# Patient Record
Sex: Male | Born: 1950 | Race: Black or African American | Hispanic: Yes | Marital: Married | Smoking: Former smoker
Health system: Southern US, Community
[De-identification: ages and names within clinical notes are randomized; demographics above are authoritative.]

## PROBLEM LIST (undated history)

## (undated) DIAGNOSIS — I10 Essential (primary) hypertension: Secondary | ICD-10-CM

## (undated) DIAGNOSIS — Z923 Personal history of irradiation: Secondary | ICD-10-CM

## (undated) DIAGNOSIS — Z1379 Encounter for other screening for genetic and chromosomal anomalies: Principal | ICD-10-CM

## (undated) DIAGNOSIS — C3490 Malignant neoplasm of unspecified part of unspecified bronchus or lung: Secondary | ICD-10-CM

## (undated) DIAGNOSIS — C439 Malignant melanoma of skin, unspecified: Secondary | ICD-10-CM

## (undated) DIAGNOSIS — C801 Malignant (primary) neoplasm, unspecified: Secondary | ICD-10-CM

## (undated) HISTORY — PX: MELANOMA EXCISION: SHX5266

## (undated) HISTORY — DX: Encounter for other screening for genetic and chromosomal anomalies: Z13.79

---

## 1986-12-30 HISTORY — PX: ROTATOR CUFF REPAIR: SHX139

## 2006-12-30 DIAGNOSIS — C801 Malignant (primary) neoplasm, unspecified: Secondary | ICD-10-CM

## 2006-12-30 HISTORY — PX: PROSTATE SURGERY: SHX751

## 2006-12-30 HISTORY — DX: Malignant (primary) neoplasm, unspecified: C80.1

## 2011-12-31 DIAGNOSIS — C439 Malignant melanoma of skin, unspecified: Secondary | ICD-10-CM

## 2011-12-31 HISTORY — DX: Malignant melanoma of skin, unspecified: C43.9

## 2016-08-26 ENCOUNTER — Ambulatory Visit: Payer: BLUE CROSS/BLUE SHIELD | Attending: Nurse Practitioner | Admitting: Physical Therapy

## 2016-08-26 ENCOUNTER — Encounter: Payer: Self-pay | Admitting: Physical Therapy

## 2016-08-26 DIAGNOSIS — I890 Lymphedema, not elsewhere classified: Secondary | ICD-10-CM | POA: Diagnosis not present

## 2016-08-26 NOTE — Therapy (Signed)
[place], [name], [address] Phone: [phone]   Fax:  [phone]  Physical Therapy Evaluation  Patient Details  Name: William Wilkerson MRN: [id number] Date of Birth: 03/21/1951 Referring Provider: Dr. Ann Held Long  Encounter Date: 08/26/2016      PT End of Session - 08/26/16 1702    Visit Number 1   Number of Visits 25   Date for PT Re-Evaluation 10/21/16   PT Start Time 1608   PT Stop Time 1650   PT Time Calculation (min) 42 min   Activity Tolerance Patient tolerated treatment well   Behavior During Therapy [facility] for tasks assessed/performed      History reviewed. No pertinent past medical history.  History reviewed. No pertinent surgical history.  There were no vitals filed for this visit.       Subjective Assessment - 08/26/16 1620    Subjective I had surgery to remove the lymph nodes underneath my right arm and since then I have had swelling but it has gotten puffier. I can not wear the rings on that hand.    Pertinent History hx of prostate cancer, hx of stage III melanoma   Patient Stated Goals to get my arm normal looking   Currently in Pain? Yes   Pain Score 4    Pain Location Hand   Pain Orientation Right   Pain Descriptors / Indicators Aching   Pain Type Chronic pain   Pain Onset More than a month ago   Pain Frequency Constant   Aggravating Factors  not taking the Advil   Pain Relieving Factors Advil            [facility] PT Assessment - 08/26/16 0001      Assessment   Medical Diagnosis metastatic melanoma   Referring Provider Dr. Ann Held Long   Onset Date/Surgical Date 01/19/16   Hand Dominance Right   Prior Therapy none     Precautions   Precautions Other (comment)  lymphedema     Restrictions   Weight Bearing Restrictions No     Balance Screen   Has the patient fallen in the past 6 months No   Has the patient had a decrease in activity level  because of a fear of falling?  No   Is the patient reluctant to leave their home because of a fear of falling?  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, mostly driving    Leisure pt states he does not formally exercise     Cognition   Overall Cognitive Status Within Functional Limits for tasks assessed     Observation/Other Assessments   Other Surveys  --  LLIS: 25% impairment     ROM / Strength   AROM / PROM / Strength AROM     AROM   Overall AROM  Within functional limits for tasks performed           LYMPHEDEMA/ONCOLOGY QUESTIONNAIRE - 08/26/16 1634      Type  Cancer Type stage III metastatic melanoma     Surgeries   Axillary Lymph Node Dissection Date 01/19/16   Number Lymph Nodes Removed 26     Date Lymphedema/Swelling Started   Date 01/19/16     Treatment   Active Chemotherapy Treatment No   Past Chemotherapy Treatment No   Active Radiation Treatment No   Past Radiation Treatment No     What other symptoms do you have   Are you Having Heaviness or Tightness Yes   Are you having Pain Yes   Are you having pitting edema No   Is it Hard or Difficult finding clothes that fit Yes   Do you have infections No   Is there Decreased scar mobility Yes     Lymphedema Assessments   Lymphedema Assessments Upper extremities     Right Upper Extremity Lymphedema   15 cm Proximal to Olecranon Process 37.7 cm   Olecranon Process 34.4 cm   15 cm Proximal to Ulnar Styloid Process 33.8 cm   Just Proximal to Ulnar Styloid Process 21.1 cm   Across  Hand at [employer] 25 cm   At [place] of 2nd Digit 8 cm     Left Upper Extremity Lymphedema   15 cm Proximal to Olecranon Process 36.4 cm   Olecranon Process 32.6 cm   15 cm Proximal to Ulnar Styloid Process 30.5 cm   Just Proximal to Ulnar Styloid Process 18.9 cm   Across Hand at [employer] 23 cm   At [place] of 2nd Digit 7.5 cm                        PT Education - 08/26/16 1701    Education provided Yes   Education Details anatomy and physiology of lymphatic system and course of therapy   Person(s) Educated Patient   Methods Explanation   Comprehension Verbalized understanding           Short Term Clinic Goals - 08/26/16 1707      CC Short Term Goal  #1   Title [name] will be independent in application of compression bandaging for long term management of lymphedema.    Time 4   Period Weeks   Status New     CC Short Term Goal  #2   Title Pt will demonstrate a 1 cm decrease in circumferential measurements 15 cm proximal to ulnar styloid process on right upper extremity   Baseline 33.8   Time 4   Period Weeks   Status New     CC Short Term Goal  #3   Title Pt will be able to independently verbalize lymphedema risk reduction practices   Time 4   Period Weeks   Status New             Long Term Clinic Goals - 08/26/16 1709      CC Long Term Goal  #1   Title Pt will obtain appropriate compression garments for R UE and hand for long term management of lymphedema   Time 8   Period Weeks   Status New     CC Long Term Goal  #2   Title Pt will demonstrate a 2 cm decrease in circumferential measurements 15 cm proximal to ulnar styloid process   Baseline 33.8   Time 8   Period Weeks   Status New     CC Long Term Goal  #3   Title Pt  will be independent in self manual lymphatic drainage technique   Time 8   Period Weeks   Status New            Plan - 08/26/16 1702    Clinical Impression Statement Pt presents to PT  with history of metastatic melanoma with axillary node dissection in January 2017. Pt has developed right upper extremity edema from hand to axilla that has been present since surgery. He is unable to wear his rings on his right hand secondary to edema and he feels tightness. Pt's ROM is within functional limits throughout bilateral UE. He would benefit from skilled PT services to decrease right upper extremity lymphedema and assist pt with obtaining compression garments for long term management of lymphedema.    Rehab Potential Good   Clinical Impairments Affecting Rehab Potential pt's job may make it difficult for pt to attend appts 3x/wk   PT Frequency 3x / week   PT Duration 8 weeks   PT Treatment/Interventions ADLs/Self Care Home Management;Compression bandaging;Scar mobilization;Manual lymph drainage;Manual techniques;Taping   PT Next Visit Plan instruct pt and his wife how to apply compression bandages so they are able to do this independently   Recommended Other Services pt may benefit from a [employer]   Consulted and Agree with Plan of Care Patient      Patient will benefit from skilled therapeutic intervention in order to improve the following deficits and impairments:  Increased edema, Pain  Visit Diagnosis: Lymphedema, not elsewhere classified - Plan: PT plan of care cert/re-cert      G-Codes - 39/03/00 1721    Functional Assessment Tool Used LLIS   Functional Limitation Other PT primary   Other PT Primary Current Status (P2330) At least 20 percent but less than 40 percent impaired, limited or restricted   Other PT Primary Goal Status (Q7622) At least 1 percent but less than 20 percent impaired, limited or restricted       Problem List There are no active problems to display for this patient.   Alexia Freestone 08/26/2016, 5:24 PM  [place], [name], [address] Phone: [phone]   Fax:   [phone]  Name: William Wilkerson MRN: [id number] Date of Birth: Oct 26, 1951  Allyson Sabal, PT 08/26/16 5:24 PM

## 2016-08-28 ENCOUNTER — Ambulatory Visit: Payer: BLUE CROSS/BLUE SHIELD

## 2016-08-28 DIAGNOSIS — I890 Lymphedema, not elsewhere classified: Secondary | ICD-10-CM | POA: Diagnosis not present

## 2016-08-28 NOTE — Therapy (Signed)
[place], [name], [address] Phone: [phone]   Fax:  [phone]  Physical Therapy Treatment  Patient Details  Name: William Wilkerson MRN: [id number] Date of Birth: July 17, 1951 Referring Provider: Dr. Ann Held Long  Encounter Date: 08/28/2016      PT End of Session - 08/28/16 1652    Visit Number 2   Number of Visits 25   Date for PT Re-Evaluation 10/21/16   PT Start Time 6226   PT Stop Time 1636   PT Time Calculation (min) 79 min   Activity Tolerance Patient tolerated treatment well   Behavior During Therapy [facility] for tasks assessed/performed      No past medical history on file.  No past surgical history on file.  There were no vitals filed for this visit.      Subjective Assessment - 08/28/16 1522    Subjective My Rt hand finger joints are sore today, but they normally are.    Pertinent History hx of prostate cancer, hx of stage III melanoma   Patient Stated Goals to get my arm normal looking   Currently in Pain? No/denies                         [facility] Adult PT Treatment/Exercise - 08/28/16 0001      Self-Care   Other Self-Care Comments  Instructed pt and wife in proper care of bandages and how to wash them if they need it before next session. Also instructed pt in importance of compliance with bandaging as much as possible over the next weeks of his Active Phase of treatment.     Manual Therapy   Manual Lymphatic Drainage (MLD) In Supine: Short neck, superficial and deep abdominals, Rt inguinal nodes, Rt axillo-inguinal anastomosis, Lt axillary nodes and anterior inter-axillary anastomosis and then Rt UE from dorsal hand to lateral upper arm working proximal to distal then retracing all steps explaining principles of manual lymph drainage while performing.   Compression Bandaging Biotone lotion applied, stockinette, Elastomull to fingers 1-4, Artiflex, 1-6, 1-10 and  2-12 cm short stretch compression bandages from dorsal hand to axilla instructing pt and wife throughout.                 PT Education - 08/28/16 1649    Education provided Yes   Education Details Continued with anatomy of lymphatic system and principles of manual lymph drainage; also instructed in compression bandaging while applying to pts arm today   Person(s) Educated Patient;Spouse   Methods Explanation;Demonstration;Handout  Handout for bandaging and wife videorecorded with cellphone while therapist applied these.   Comprehension Verbalized understanding;Need further instruction           Short Term Clinic Goals - 08/26/16 1707      CC Short Term Goal  #1   Title [name] will be independent in application of compression bandaging for long term management of lymphedema.    Time 4   Period Weeks   Status New     CC Short Term Goal  #2   Title Pt will demonstrate a 1 cm decrease in circumferential measurements 15 cm proximal to ulnar styloid process on right upper extremity   Baseline 33.8   Time 4   Period Weeks   Status New     CC Short Term Goal  #3   Title Pt will be able to independently verbalize lymphedema risk reduction practices  Time 4   Period Weeks   Status New             Long Term Clinic Goals - 08/26/16 1709      CC Long Term Goal  #1   Title Pt will obtain appropriate compression garments for R UE and hand for long term management of lymphedema   Time 8   Period Weeks   Status New     CC Long Term Goal  #2   Title Pt will demonstrate a 2 cm decrease in circumferential measurements 15 cm proximal to ulnar styloid process   Baseline 33.8   Time 8   Period Weeks   Status New     CC Long Term Goal  #3   Title Pt will be independent in self manual lymphatic drainage technique   Time 8   Period Weeks   Status New            Plan - 08/28/16 1652    Clinical Impression Statement Initial session today of complete  decongestive therapy. Wife present for session. Pt and wife demonstrated a good understanding of basic principles of manual lymph drainage by end of session today and also and good basic understanding of compression bandaging. Wife videorecorded this with her phone so she can try to reapply over long holiday weekend before pt returns for next visit in a week. Pt reported bandage felt good upon leaving with no tingling or c/o pain in Rt UE.   Rehab Potential Good   Clinical Impairments Affecting Rehab Potential pt's job may make it difficult for pt to attend appts 3x/wk   PT Frequency 3x / week   PT Duration 8 weeks   PT Treatment/Interventions ADLs/Self Care Home Management;Compression bandaging;Scar mobilization;Manual lymph drainage;Manual techniques;Taping   PT Next Visit Plan Review bandaging prn with pt (an dwife if she is able to come again) and continue complete decongestive therapy. Measure circumference at next visit as a week will have gone by.   Consulted and Agree with Plan of Care Patient;Family member/caregiver   Family Member Consulted Wife      Patient will benefit from skilled therapeutic intervention in order to improve the following deficits and impairments:  Increased edema, Pain  Visit Diagnosis: Lymphedema, not elsewhere classified     Problem List There are no active problems to display for this patient.   Otelia Limes, PTA 08/28/2016, 4:58 PM  [place], [name], [address] Phone: [phone]   Fax:  [phone]  Name: William Wilkerson MRN: [id number] Date of Birth: 1951/04/18

## 2016-09-04 ENCOUNTER — Ambulatory Visit: Payer: BLUE CROSS/BLUE SHIELD

## 2016-09-05 ENCOUNTER — Ambulatory Visit: Payer: BLUE CROSS/BLUE SHIELD | Admitting: Physical Therapy

## 2016-09-09 ENCOUNTER — Ambulatory Visit: Payer: BLUE CROSS/BLUE SHIELD | Attending: Nurse Practitioner | Admitting: Physical Therapy

## 2016-09-09 DIAGNOSIS — I890 Lymphedema, not elsewhere classified: Secondary | ICD-10-CM | POA: Insufficient documentation

## 2016-09-09 NOTE — Therapy (Signed)
[place], [name], [address] Phone: [phone]   Fax:  [phone]  Physical Therapy Treatment  Patient Details  Name: William Wilkerson MRN: [id number] Date of Birth: [birth year] Referring Provider: Dr. Ann Held Long  Encounter Date: 09/09/2016      PT End of Session - 09/09/16 2013    Visit Number 3   Number of Visits 25   Date for PT Re-Evaluation 10/21/16   PT Start Time 1510   PT Stop Time 1605   PT Time Calculation (min) 55 min   Activity Tolerance Patient tolerated treatment well   Behavior During Therapy [facility] for tasks assessed/performed      No past medical history on file.  No past surgical history on file.  There were no vitals filed for this visit.      Subjective Assessment - 09/09/16 1512    Subjective I had to take the wrap off a couple days after being here last time, but I was surprised how much    Currently in Pain? Yes   Pain Score 4    Pain Location Axilla   Pain Orientation Right   Pain Descriptors / Indicators Aching   Aggravating Factors  mashing on it   Pain Relieving Factors Advil                         [facility] Adult PT Treatment/Exercise - 09/09/16 0001      Self-Care   Other Self-Care Comments  Educated patient further about the necessity of wearing bandages from one appointment to the next and not removing them early; educated briefly about daytime, nighttime garments and about availability of pumps to mimic manual lymph drainage.     Manual Therapy   Manual Lymphatic Drainage (MLD) In Supine: Short neck, superficial and deep abdominals, Rt inguinal nodes, Rt axillo-inguinal anastomosis, Lt axillary nodes and anterior inter-axillary anastomosis and then Rt UE from dorsal hand to lateral upper arm working proximal to distal then retracing all steps.  This performed by Saverio Danker, SPT, with direct supervision by Serafina Royals, PT.   Compression Bandaging Lotion applied, stockinette, Elastomull to fingers 1-4, Artiflex, 1-6, 1-10 and 2-12 cm short stretch compression bandages from dorsal hand to axilla.  Issued new bandages today.                PT Education - 09/09/16 2012    Education provided Yes   Education Details reviewed remedial UE lymphedema exercise and reminded patient to do this in case of pain or paresthesias, but to remove bandages if either of these is unrelieved   Person(s) Educated Patient   Methods Explanation;Demonstration   Comprehension Verbalized understanding           Short Term Clinic Goals - 08/26/16 1707      CC Short Term Goal  #1   Title [name] will be independent in application of compression bandaging for long term management of lymphedema.    Time 4   Period Weeks   Status New     CC Short Term Goal  #2   Title Pt will demonstrate a 1 cm decrease in circumferential measurements 15 cm proximal to ulnar styloid process on right upper extremity   Baseline 33.8   Time 4   Period Weeks   Status New     CC Short Term Goal  #3   Title Pt will be able to independently verbalize  lymphedema risk reduction practices   Time 4   Period Weeks   Status New             Long Term Clinic Goals - 08/26/16 1709      CC Long Term Goal  #1   Title Pt will obtain appropriate compression garments for R UE and hand for long term management of lymphedema   Time 8   Period Weeks   Status New     CC Long Term Goal  #2   Title Pt will demonstrate a 2 cm decrease in circumferential measurements 15 cm proximal to ulnar styloid process   Baseline 33.8   Time 8   Period Weeks   Status New     CC Long Term Goal  #3   Title Pt will be independent in self manual lymphatic drainage technique   Time 8   Period Weeks   Status New            Plan - 09/09/16 2013    Clinical Impression Statement Patient came in reporting a signficant improvement in swelling after  having had bandages on for a couple of days; bandages were removed and some of the swelling returned, but he was able to see that this treatment works.  Despite that, patient questioned his need to keep bandages on until subsequent treatment sessions, so this was reviewed with him.  We discussed that if he keeps the bandages on he may not need to continue complete decongestive therapy for very long.   Rehab Potential Good   Clinical Impairments Affecting Rehab Potential pt's job may make it difficult for pt to attend appts 3x/wk   PT Frequency 3x / week   PT Duration 8 weeks   PT Treatment/Interventions ADLs/Self Care Home Management;Compression bandaging;Scar mobilization;Manual lymph drainage;Manual techniques;Taping   PT Next Visit Plan Review bandaging prn with pt (and wife if she is able to come again) and continue complete decongestive therapy. Measure circumferences.  Begin going over day and nighttime garments for patient to make choices about these.   PT Home Exercise Plan remedial UE lymphedema exercises   Recommended Other Services will fax face sheet to [employer]   Consulted and Agree with Plan of Care Patient;Family member/caregiver      Patient will benefit from skilled therapeutic intervention in order to improve the following deficits and impairments:  Increased edema, Pain  Visit Diagnosis: Lymphedema, not elsewhere classified     Problem List There are no active problems to display for this patient.   [place] 09/09/2016, 8:18 PM  [place], [name], [address] Phone: [phone]   Fax:  [phone]  Name: William Wilkerson MRN: [id number] Date of Birth: 10-05-1951   Serafina Royals, PT 09/09/16 8:19 PM

## 2016-09-11 ENCOUNTER — Ambulatory Visit: Payer: BLUE CROSS/BLUE SHIELD

## 2016-09-11 DIAGNOSIS — I890 Lymphedema, not elsewhere classified: Secondary | ICD-10-CM | POA: Diagnosis not present

## 2016-09-11 NOTE — Therapy (Signed)
[place], [name], [address] Phone: [phone]   Fax:  [phone]  Physical Therapy Treatment  Patient Details  Name: Aquiles Ruffini MRN: [id number] Date of Birth: 1951/02/09 Referring Provider: Dr. Ann Held Long  Encounter Date: 09/11/2016      PT End of Session - 09/11/16 1706    Visit Number 4   Number of Visits 25   Date for PT Re-Evaluation 10/21/16   PT Start Time 1602   PT Stop Time 1647   PT Time Calculation (min) 45 min   Activity Tolerance Patient tolerated treatment well   Behavior During Therapy [facility] for tasks assessed/performed      No past medical history on file.  No past surgical history on file.  There were no vitals filed for this visit.      Subjective Assessment - 09/11/16 1606    Subjective The wrap was a too tight so I had to take it off yesterday evening.    Pertinent History hx of prostate cancer, hx of stage III melanoma   Patient Stated Goals to get my arm normal looking   Currently in Pain? Yes   Pain Score 4    Pain Location Finger (Comment which one)  PIP joints   Pain Orientation Right   Pain Descriptors / Indicators Aching   Pain Type Chronic pain   Pain Onset More than a month ago   Pain Frequency Constant   Aggravating Factors  increased pressure here   Pain Relieving Factors when it's less swollen               LYMPHEDEMA/ONCOLOGY QUESTIONNAIRE - 09/11/16 1608      Right Upper Extremity Lymphedema   15 cm Proximal to Olecranon Process 38.3 cm   Olecranon Process 34.4 cm   15 cm Proximal to Ulnar Styloid Process 32.8 cm   Just Proximal to Ulnar Styloid Process 21.6 cm   Across Hand at [employer] 25.6 cm   At [place] of 2nd Digit 8.1 cm                  [facility] Adult PT Treatment/Exercise - 09/11/16 0001      Manual Therapy   Manual Lymphatic Drainage (MLD) In Supine: Short neck, superficial and deep abdominals, Rt  inguinal nodes, Rt axillo-inguinal anastomosis, Lt axillary nodes and anterior inter-axillary anastomosis and then Rt UE from dorsal hand to lateral upper arm working proximal to distal then retracing all steps.   Compression Bandaging Lotion applied, stockinette, Elastomull to fingers 1-4, Artiflex with 1/2" gray foam at dorsal hand, 1-6, 1-10 and 2-12 cm short stretch compression bandages from dorsal hand to axilla done by Saverio Danker, SPT with direct supervision by Collie Siad, PTA.                   Short Term Clinic Goals - 09/11/16 1711      CC Short Term Goal  #1   Title [name] will be independent in application of compression bandaging for long term management of lymphedema.    Status On-going     CC Short Term Goal  #2   Title Pt will demonstrate a 1 cm decrease in circumferential measurements 15 cm proximal to ulnar styloid process on right upper extremity   Baseline 33.8; 32.8 cm 09/11/16   Status Achieved     CC Short Term Goal  #3   Title Pt will be able to  independently verbalize lymphedema risk reduction practices   Status On-going             Long Term Clinic Goals - 08/26/16 1709      CC Long Term Goal  #1   Title Pt will obtain appropriate compression garments for R UE and hand for long term management of lymphedema   Time 8   Period Weeks   Status New     CC Long Term Goal  #2   Title Pt will demonstrate a 2 cm decrease in circumferential measurements 15 cm proximal to ulnar styloid process   Baseline 33.8   Time 8   Period Weeks   Status New     CC Long Term Goal  #3   Title Pt will be independent in self manual lymphatic drainage technique   Time 8   Period Weeks   Status New            Plan - 09/11/16 1706    Clinical Impression Statement Pt reported his bandage had felt too tight after last visit and had to remove ityesterday evening. His circumference measurements overall were reduced today from initial  visit and pt was pleased at this. Upon leaving pt reported he could tell foam at hand felt good and bandages weren't too tight.    Rehab Potential Good   Clinical Impairments Affecting Rehab Potential pt's job may make it difficult for pt to attend appts 3x/wk   PT Frequency 3x / week   PT Duration 8 weeks   PT Treatment/Interventions ADLs/Self Care Home Management;Compression bandaging;Scar mobilization;Manual lymph drainage;Manual techniques;Taping   PT Next Visit Plan Review bandaging prn with pt (and wife if she is able to come again) and continue complete decongestive therapy. Begin going over day and nighttime garments for patient to make choices about these.   PT Home Exercise Plan remedial UE lymphedema exercises   Consulted and Agree with Plan of Care Patient      Patient will benefit from skilled therapeutic intervention in order to improve the following deficits and impairments:  Increased edema, Pain  Visit Diagnosis: Lymphedema, not elsewhere classified     Problem List There are no active problems to display for this patient.   Otelia Limes, PTA 09/11/2016, 5:12 PM  [place], [name], [address] Phone: [phone]   Fax:  [phone]  Name: Deshane Cotroneo MRN: [id number] Date of Birth: Sep 13, 1951

## 2016-09-12 ENCOUNTER — Ambulatory Visit: Payer: BLUE CROSS/BLUE SHIELD

## 2016-09-12 DIAGNOSIS — I890 Lymphedema, not elsewhere classified: Secondary | ICD-10-CM

## 2016-09-12 NOTE — Therapy (Signed)
[place], [name], [address] Phone: [phone]   Fax:  [phone]  Physical Therapy Treatment  Patient Details  Name: Kinney Sackmann MRN: [id number] Date of Birth: [birth year] Referring Provider: Dr. Ann Held Long  Encounter Date: 09/12/2016      PT End of Session - 09/12/16 1533    Visit Number 5   Number of Visits 25   Date for PT Re-Evaluation 10/21/16   PT Start Time 1062   PT Stop Time 1525   PT Time Calculation (min) 42 min   Activity Tolerance Patient tolerated treatment well   Behavior During Therapy [facility] for tasks assessed/performed      No past medical history on file.  No past surgical history on file.  There were no vitals filed for this visit.      Subjective Assessment - 09/12/16 1446    Subjective The bandage was a little tight at the elbow but I feel like my arm went down. My thumb is hurting a little, maybe we should wrap it today.    Pertinent History hx of prostate cancer, hx of stage III melanoma   Patient Stated Goals to get my arm normal looking   Currently in Pain? Yes   Pain Score 4    Pain Location Finger (Comment which one)  Thumb   Pain Orientation Right   Pain Descriptors / Indicators Aching;Dull   Pain Type Acute pain   Pain Onset Yesterday   Pain Frequency Constant   Aggravating Factors  not being wrapped maybe   Pain Relieving Factors being wrapped               LYMPHEDEMA/ONCOLOGY QUESTIONNAIRE - 09/12/16 1448      Right Upper Extremity Lymphedema   15 cm Proximal to Olecranon Process 38.1 cm   Olecranon Process 33.4 cm   15 cm Proximal to Ulnar Styloid Process 33.1 cm   Just Proximal to Ulnar Styloid Process 21.6 cm   Across Hand at [employer] 24.4 cm   At [place] of 2nd Digit 8 cm                  [facility] Adult PT Treatment/Exercise - 09/12/16 0001      Manual Therapy   Manual therapy comments Remeaseured circumference  again today per pt request   Manual Lymphatic Drainage (MLD) In Supine: Short neck, superficial and deep abdominals, Rt inguinal nodes, Rt axillo-inguinal anastomosis, Lt axillary nodes and anterior inter-axillary anastomosis and then Rt UE from dorsal hand to lateral upper arm working proximal to distal then retracing all steps.   Compression Bandaging Lotion applied, stockinette, Elastomull to fingers 1-4, Artiflex with 1/2" gray foam at dorsal hand, 1-6, 1-10 and 1-12 cm short stretch compression bandages from dorsal hand to axilla.                   Short Term Clinic Goals - 09/11/16 1711      CC Short Term Goal  #1   Title [name] will be independent in application of compression bandaging for long term management of lymphedema.    Status On-going     CC Short Term Goal  #2   Title Pt will demonstrate a 1 cm decrease in circumferential measurements 15 cm proximal to ulnar styloid process on right upper extremity   Baseline 33.8; 32.8 cm 09/11/16   Status Achieved     CC Short Term Goal  #3  Title Pt will be able to independently verbalize lymphedema risk reduction practices   Status On-going             Long Term Clinic Goals - 08/26/16 1709      CC Long Term Goal  #1   Title Pt will obtain appropriate compression garments for R UE and hand for long term management of lymphedema   Time 8   Period Weeks   Status New     CC Long Term Goal  #2   Title Pt will demonstrate a 2 cm decrease in circumferential measurements 15 cm proximal to ulnar styloid process   Baseline 33.8   Time 8   Period Weeks   Status New     CC Long Term Goal  #3   Title Pt will be independent in self manual lymphatic drainage technique   Time 8   Period Weeks   Status New            Plan - 09/12/16 1535    Clinical Impression Statement Pt reported his bandages felt better than 2 visits ago but still felt  alittle tight at the forearm/elbow area and took them off  shortly before coming in for appointment today so only used 1-12 cm bandage today to see if this would help him tolerate wear longer. He did feel his arm looked smaller though and some of his measurements were, indeed, smaller when compared to yesterday which pt was pleased with. He will probably be ready to be measured for his compression garments soon as he is unable to be consistent with appointments due to his job and, thus bandages don't stay on for long enough periods of time to be as effective as they could be. Also issued pt business card for [employer], [employer] rep, and pt reported he had already heard from him but needs to call him back.    Rehab Potential Good   Clinical Impairments Affecting Rehab Potential pt's job may make it difficult for pt to attend appts 3x/wk   PT Frequency 3x / week   PT Duration 8 weeks   PT Treatment/Interventions ADLs/Self Care Home Management;Compression bandaging;Scar mobilization;Manual lymph drainage;Manual techniques;Taping   PT Next Visit Plan Review bandaging prn with pt (and wife if she is able to come again) and continue complete decongestive therapy. Begin going over day and nighttime garments for patient to make choices about these and possibly have pt measured soon.   PT Home Exercise Plan remedial UE lymphedema exercises; try to rewrap arm between visits   Consulted and Agree with Plan of Care Patient      Patient will benefit from skilled therapeutic intervention in order to improve the following deficits and impairments:  Increased edema, Pain  Visit Diagnosis: Lymphedema, not elsewhere classified     Problem List There are no active problems to display for this patient.   Otelia Limes, PTA 09/12/2016, 3:40 PM  [place], [name], [address] Phone: [phone]   Fax:  [phone]  Name: Kyrell Ruacho MRN: [id number] Date of Birth:  23-Jul-1951

## 2016-09-16 ENCOUNTER — Ambulatory Visit: Payer: BLUE CROSS/BLUE SHIELD | Admitting: Physical Therapy

## 2016-09-16 DIAGNOSIS — I890 Lymphedema, not elsewhere classified: Secondary | ICD-10-CM

## 2016-09-16 NOTE — Therapy (Signed)
[place], [name], [address] Phone: [phone]   Fax:  [phone]  Physical Therapy Treatment  Patient Details  Name: William Wilkerson MRN: [id number] Date of Birth: [birth year] Referring Provider: Dr. Ann Held Long  Encounter Date: 09/16/2016      PT End of Session - 09/16/16 1638    Visit Number 6   Number of Visits 25   Date for PT Re-Evaluation 10/21/16   PT Start Time 1324   PT Stop Time 1635   PT Time Calculation (min) 46 min   Activity Tolerance Patient tolerated treatment well   Behavior During Therapy [facility] for tasks assessed/performed      No past medical history on file.  No past surgical history on file.  There were no vitals filed for this visit.      Subjective Assessment - 09/16/16 1550    Subjective "The bandage was a little better after last time, but my thumb still hurt. I took the bandages off on Saturday."    Pertinent History hx of prostate cancer, hx of stage III melanoma   Patient Stated Goals to get my arm normal looking   Currently in Pain? Yes   Pain Score 4    Pain Location Finger (Comment which one)  thumb   Pain Orientation Right   Pain Descriptors / Indicators Aching;Dull   Pain Type Acute pain   Pain Onset In the past 7 days   Pain Frequency Constant                         [facility] Adult PT Treatment/Exercise - 09/16/16 0001      Manual Therapy   Manual Lymphatic Drainage (MLD) In Supine: Short neck, superficial and deep abdominals, Rt inguinal nodes, Rt axillo-inguinal anastomosis, Lt axillary nodes and anterior inter-axillary anastomosis and then Rt UE from dorsal hand and fingers to lateral upper arm working proximal to distal then retracing all steps.   Compression Bandaging Lotion applied, stockinette, Elastomull to fingers 1-4, Artiflex with 1/2" gray foam at dorsal hand, 1-6, 1-10 and 1-12 cm short stretch compression bandages from  dorsal hand to axilla.                   Short Term Clinic Goals - 09/11/16 1711      CC Short Term Goal  #1   Title [name] will be independent in application of compression bandaging for long term management of lymphedema.    Status On-going     CC Short Term Goal  #2   Title Pt will demonstrate a 1 cm decrease in circumferential measurements 15 cm proximal to ulnar styloid process on right upper extremity   Baseline 33.8; 32.8 cm 09/11/16   Status Achieved     CC Short Term Goal  #3   Title Pt will be able to independently verbalize lymphedema risk reduction practices   Status On-going             Long Term Clinic Goals - 08/26/16 1709      CC Long Term Goal  #1   Title Pt will obtain appropriate compression garments for R UE and hand for long term management of lymphedema   Time 8   Period Weeks   Status New     CC Long Term Goal  #2   Title Pt will demonstrate a 2 cm decrease in circumferential measurements 15 cm proximal to  ulnar styloid process   Baseline 33.8   Time 8   Period Weeks   Status New     CC Long Term Goal  #3   Title Pt will be independent in self manual lymphatic drainage technique   Time 8   Period Weeks   Status New            Plan - 09/16/16 1638    Clinical Impression Statement Patient states the bandages felt better after last session, but still had some thumb pain. He took the bandages off on Saturday, so they stayed on about 2 days. Continued to only use 1-12 cm bandage due to the arm pain. Patient reports he thinks his arm is smaller. Donneta Romberg and Nestor Ramp, [employer] reps, were present at the beginning of the session to provide information and schedule a time for them to bring the pump to the patient's house.    Rehab Potential Good   Clinical Impairments Affecting Rehab Potential pt's job may make it difficult for pt to attend appts 3x/wk   PT Frequency 3x / week   PT Duration 8 weeks   PT  Treatment/Interventions ADLs/Self Care Home Management;Compression bandaging;Scar mobilization;Manual lymph drainage;Manual techniques;Taping   PT Next Visit Plan Reassess circumference measurements; continue complete decongestive therapy; discuss compression garment options with patient and where to purchase for patient to be measured soon   Consulted and Agree with Plan of Care Patient      Patient will benefit from skilled therapeutic intervention in order to improve the following deficits and impairments:  Increased edema, Pain  Visit Diagnosis: Lymphedema, not elsewhere classified     Problem List There are no active problems to display for this patient.   Mellody Life 09/16/2016, 4:46 PM  [place], [name], [address] Phone: [phone]   Fax:  [phone]  Name: Linas Stepter MRN: [id number] Date of Birth: 1951/02/07  Saverio Danker, SPT  This entire session was guided, instructed, and directly supervised by Serafina Royals, PT.  Read, reviewed, edited and agree with student's findings and recommendations.   Serafina Royals, PT 09/16/16 4:56 PM

## 2016-09-18 ENCOUNTER — Ambulatory Visit: Payer: BLUE CROSS/BLUE SHIELD

## 2016-09-18 DIAGNOSIS — I890 Lymphedema, not elsewhere classified: Secondary | ICD-10-CM | POA: Diagnosis not present

## 2016-09-18 NOTE — Therapy (Signed)
[place], [name], [address] Phone: [phone]   Fax:  [phone]  Physical Therapy Treatment  Patient Details  Name: William Wilkerson MRN: [id number] Date of Birth: 04-01-1951 Referring Provider: Dr. Ann Held Long  Encounter Date: 09/18/2016      PT End of Session - 09/18/16 1552    Visit Number 7   Number of Visits 25   Date for PT Re-Evaluation 10/21/16   PT Start Time 3151   PT Stop Time 1640   PT Time Calculation (min) 57 min   Activity Tolerance Patient tolerated treatment well   Behavior During Therapy [facility] for tasks assessed/performed      No past medical history on file.  No past surgical history on file.  There were no vitals filed for this visit.      Subjective Assessment - 09/18/16 1545    Subjective The bandage was a little tight at my upper arm so I took it off and my wife tried to rewrap it but then it came off yesterday. But my hand looked good! Bobbi from [employer] is supposed to meet me here to discuss my [employer].    Pertinent History hx of prostate cancer, hx of stage III melanoma   Patient Stated Goals to get my arm normal looking   Currently in Pain? Yes   Pain Score 4    Pain Location Toe (Comment which one)  Thumb   Pain Orientation Right   Pain Descriptors / Indicators Aching;Dull   Pain Type Acute pain   Pain Onset 1 to 4 weeks ago   Pain Frequency Constant   Aggravating Factors  increased swelling, maybe arthritis   Pain Relieving Factors being wrapped and when my swelling is reduced               LYMPHEDEMA/ONCOLOGY QUESTIONNAIRE - 09/18/16 1547      Right Upper Extremity Lymphedema   15 cm Proximal to Olecranon Process 38.5 cm   Olecranon Process 34.6 cm   15 cm Proximal to Ulnar Styloid Process 32.9 cm   Just Proximal to Ulnar Styloid Process 21.7 cm   Across Hand at [employer] 24.6 cm   At [place] of 2nd Digit 8.1 cm                   [facility] Adult PT Treatment/Exercise - 09/18/16 0001      Self-Care   Self-Care Other Self-Care Comments   Other Self-Care Comments  Instructed pt in importance of compliance of wear of bandages between sessions for optimal reduction of fluid in his Rt UE. Also instructed pt that if he isn't willing or able to commit to wearing bandages consistently between visits then it's time for [place] to go ahead and get him measured for compression garments.      Manual Therapy   Manual Lymphatic Drainage (MLD) In Supine: Short neck, superficial and deep abdominals, Rt inguinal nodes, Rt axillo-inguinal anastomosis, Lt axillary nodes and anterior inter-axillary anastomosis and then Rt UE from dorsal hand and fingers to lateral upper arm working proximal to distal then retracing all steps performed by Saverio Danker, SPT directly observed by Collie Siad, PTA   Compression Bandaging Lotion applied, stockinette, Elastomull to fingers 1-4, Artiflex with 1/2" gray foam at dorsal hand, 1-6, 1-10 and 1-12 cm short stretch compression bandages from dorsal hand to axilla, all done by Saverio Danker, SPT and directly observed by Collie Siad, PTA except 6 cm hand  bandage which pt had reported felt tight after SPT so PTA rewrapped that one.                   Short Term Clinic Goals - 09/18/16 1649      CC Short Term Goal  #1   Title [name] will be independent in application of compression bandaging for long term management of lymphedema.    Status On-going     CC Short Term Goal  #2   Title Pt will demonstrate a 1 cm decrease in circumferential measurements 15 cm proximal to ulnar styloid process on right upper extremity   Baseline 33.8; 32.8 cm 09/11/16; 32.9 cm 09/18/16   Status On-going     CC Short Term Goal  #3   Title Pt will be able to independently verbalize lymphedema risk reduction practices   Status On-going             Long Term Clinic  Goals - 09/18/16 1649      CC Long Term Goal  #1   Title Pt will obtain appropriate compression garments for R UE and hand for long term management of lymphedema   Status On-going     CC Long Term Goal  #2   Title Pt will demonstrate a 2 cm decrease in circumferential measurements 15 cm proximal to ulnar styloid process   Baseline 33.8; 32.9 cm 09/18/16   Status On-going     CC Long Term Goal  #3   Title Pt will be independent in self manual lymphatic drainage technique   Status On-going            Plan - 09/18/16 1641    Clinical Impression Statement Pt came in reporting the bandages overall felt better except for some tightness at his upper arm so he took them off yesterday afternoon. His wife had tried to rewrap it but he reports it didn't stay on. Pt is eager for his arm to reduce but still isn't being as compliant as required for the reductions he is hopeful so spent the end of session reminding pt of the importance of him being compliant with wearing his bandages more consistently and reapplying them when they come off. If he doesn't want to wear them more frequently then we should go ahead and get him measured for his compression garments. Pt would at least like to continue coming this week and next and will try to be more compliant with his wear time. Donneta Romberg, [employer] rep, came in for a few mins to have pt sign HIPPA waiver and to get 2 measurements for his pump order. He reports this should arrive in about 4 weeks due to pts insurance taking that long.   Rehab Potential Good   Clinical Impairments Affecting Rehab Potential pt's job may make it difficult for pt to attend appts 3x/wk   PT Frequency 3x / week   PT Duration 8 weeks   PT Treatment/Interventions ADLs/Self Care Home Management;Compression bandaging;Scar mobilization;Manual lymph drainage;Manual techniques;Taping   PT Next Visit Plan Continue complete decongestive therapy but focus on teaching pt to bandage his  arm at next visit so he can wear bandages more consistently between visits; discuss compression garment options with patient and where to purchase for patient to be measured. Next week assess if pt has been more consistent with wear of bandages, if not, have him measured for garments.   PT Home Exercise Plan remedial UE lymphedema exercises; try to rewrap  arm between visits   Consulted and Agree with Plan of Care Patient      Patient will benefit from skilled therapeutic intervention in order to improve the following deficits and impairments:  Increased edema, Pain  Visit Diagnosis: Lymphedema, not elsewhere classified     Problem List There are no active problems to display for this patient.   Otelia Limes, PTA 09/18/2016, 5:00 PM  [place], [name], [address] Phone: [phone]   Fax:  [phone]  Name: William Wilkerson MRN: [id number] Date of Birth: 10/23/1951

## 2016-09-19 ENCOUNTER — Ambulatory Visit: Payer: BLUE CROSS/BLUE SHIELD | Admitting: Physical Therapy

## 2016-09-19 DIAGNOSIS — I890 Lymphedema, not elsewhere classified: Secondary | ICD-10-CM | POA: Diagnosis not present

## 2016-09-19 NOTE — Therapy (Addendum)
[name], [name], [address] Phone: [phone]   Fax:  [phone]  Physical Therapy Treatment  Patient Details  Name: William Wilkerson MRN: [id number] Date of Birth: [birth year] Referring Provider: Dr. Ann Held Long  Encounter Date: 09/19/2016      PT End of Session - 09/19/16 1608    Visit Number 8   Number of Visits 25   Date for PT Re-Evaluation 10/21/16   PT Start Time 4765   PT Stop Time 1608   PT Time Calculation (min) 53 min   Activity Tolerance Patient tolerated treatment well   Behavior During Therapy [facility] for tasks assessed/performed      No past medical history on file.  No past surgical history on file.  There were no vitals filed for this visit.      Subjective Assessment - 09/19/16 1517    Subjective The bandages felt better this time and I was able to keep the bandages on until about 2 hours ago. I feel like the swelling in my arm and hand is better today.   Pertinent History hx of prostate cancer, hx of stage III melanoma   Patient Stated Goals to get my arm normal looking   Currently in Pain? Yes   Pain Score 3    Pain Location Finger (Comment which one)  thumb   Pain Orientation Right   Pain Descriptors / Indicators Aching;Dull   Pain Type Acute pain   Pain Onset 1 to 4 weeks ago   Aggravating Factors  increased swelling, maybe arthritis   Pain Relieving Factors when swelling is reduced               LYMPHEDEMA/ONCOLOGY QUESTIONNAIRE - 09/19/16 1519      Right Upper Extremity Lymphedema   15 cm Proximal to Olecranon Process 37.9 cm   Olecranon Process 33.9 cm   15 cm Proximal to Ulnar Styloid Process 33.1 cm   Just Proximal to Ulnar Styloid Process 21.1 cm   Across Hand at [employer] 23.7 cm   At [place] of 2nd Digit 8.1 cm                  [facility] Adult PT Treatment/Exercise - 09/19/16 0001      Self-Care   Other Self-Care Comments   educated patient on types of compression garments and instructed patient on elevating arm at home to reduce swelling and discomfort     Manual Therapy   Manual Therapy Edema management   Edema Management circumference measurements taken   Manual Lymphatic Drainage (MLD) In Supine: Short neck, superficial and deep abdominals, Rt inguinal nodes, Rt axillo-inguinal anastomosis, Lt axillary nodes and anterior inter-axillary anastomosis and then Rt UE from dorsal hand and fingers to lateral upper arm working proximal to distal then retracing all steps   Compression Bandaging Lotion applied, stockinette, Elastomull to fingers 1-4, Artiflex with 1/2" gray foam at dorsal hand, 1-6, 1-10 and 1-12 cm short stretch compression bandages from dorsal hand to axilla.                   Short Term Clinic Goals - 09/18/16 1649      CC Short Term Goal  #1   Title [name] will be independent in application of compression bandaging for long term management of lymphedema.    Status On-going     CC Short Term Goal  #2   Title Pt will demonstrate a  1 cm decrease in circumferential measurements 15 cm proximal to ulnar styloid process on right upper extremity   Baseline 33.8; 32.8 cm 09/11/16; 32.9 cm 09/18/16   Status On-going     CC Short Term Goal  #3   Title Pt will be able to independently verbalize lymphedema risk reduction practices   Status On-going             Long Term Clinic Goals - 09/18/16 1649      CC Long Term Goal  #1   Title Pt will obtain appropriate compression garments for R UE and hand for long term management of lymphedema   Status On-going     CC Long Term Goal  #2   Title Pt will demonstrate a 2 cm decrease in circumferential measurements 15 cm proximal to ulnar styloid process   Baseline 33.8; 32.9 cm 09/18/16   Status On-going     CC Long Term Goal  #3   Title Pt will be independent in self manual lymphatic drainage technique   Status On-going             Plan - 09/19/16 1705    Clinical Impression Statement Patient states he removed his bandages about 2 hours prior to session so that he could take a shower. He demonstrates significantly improved circumference measurements this session compared to yesterday. He states he does not think he would be able to perform the bandaging on his own. He was educated on the types of daytime and nighttime compression garments today and is interested in ordering those soon. Therapist faxed his information over to [place] today.   Rehab Potential Good   Clinical Impairments Affecting Rehab Potential pt's job may make it difficult for pt to attend appts 3x/wk   PT Frequency 3x / week   PT Duration 8 weeks   PT Treatment/Interventions ADLs/Self Care Home Management;Compression bandaging;Scar mobilization;Manual lymph drainage;Manual techniques;Taping   PT Next Visit Plan continue complete decongestive therapy of right UE   PT Home Exercise Plan elevate arm if there is discomfort with bandaging   Consulted and Agree with Plan of Care Patient      Patient will benefit from skilled therapeutic intervention in order to improve the following deficits and impairments:  Increased edema, Pain  Visit Diagnosis: Lymphedema, not elsewhere classified     Problem List There are no active problems to display for this patient.   Mellody Life 09/19/2016, 5:10 PM  [place], [name], [address] Phone: [phone]   Fax:  [phone]  Name: William Wilkerson MRN: [id number] Date of Birth: 1951-07-15  Saverio Danker, SPT PHYSICAL THERAPY DISCHARGE SUMMARY  Visits from Start of Care: 8  Current functional level related to goals / functional outcomes: Unknown as pt has not returned for treatment      Education / Equipment: Self manual lymph drainage.  How to get compression pump and garments  Plan:                                                     Patient goals were partially met. Patient is being discharged due to not returning since the last visit.  ?????    Maudry Diego, PT 12/11/16 10:09 AM

## 2016-09-23 ENCOUNTER — Encounter: Payer: Medicare Other | Admitting: Physical Therapy

## 2016-09-24 ENCOUNTER — Ambulatory Visit: Payer: BLUE CROSS/BLUE SHIELD | Admitting: Physical Therapy

## 2016-09-25 ENCOUNTER — Ambulatory Visit: Payer: BLUE CROSS/BLUE SHIELD

## 2016-09-26 ENCOUNTER — Encounter: Payer: Medicare Other | Admitting: Physical Therapy

## 2016-09-30 ENCOUNTER — Ambulatory Visit: Payer: BLUE CROSS/BLUE SHIELD | Admitting: Physical Therapy

## 2016-10-03 ENCOUNTER — Ambulatory Visit: Payer: BLUE CROSS/BLUE SHIELD | Admitting: Physical Therapy

## 2016-10-07 ENCOUNTER — Ambulatory Visit: Payer: BLUE CROSS/BLUE SHIELD | Admitting: Physical Therapy

## 2016-10-09 ENCOUNTER — Ambulatory Visit: Payer: BLUE CROSS/BLUE SHIELD

## 2017-03-28 IMAGING — CT NM OUTSIDE FILMS CHEST
1 of 8 series · 2 of 25 positions shown · non-contrast
Comparison: none

[Series 4: ct wb b30f · axial · 5.0mm · 0.98mm/px · z∈[-74,+546]mm · 2 of 372 slices shown]
[im 124/372  lung]
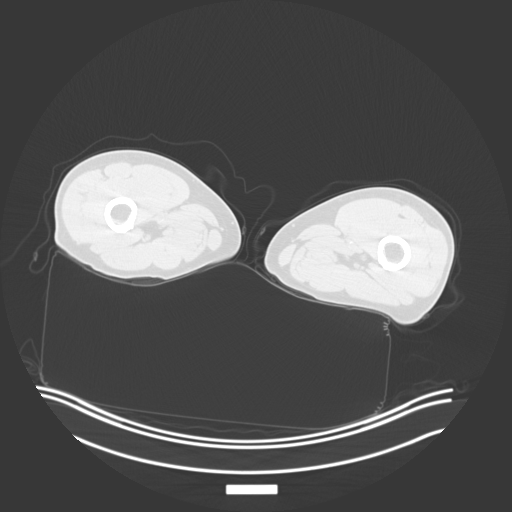
[im 248/372  lung]
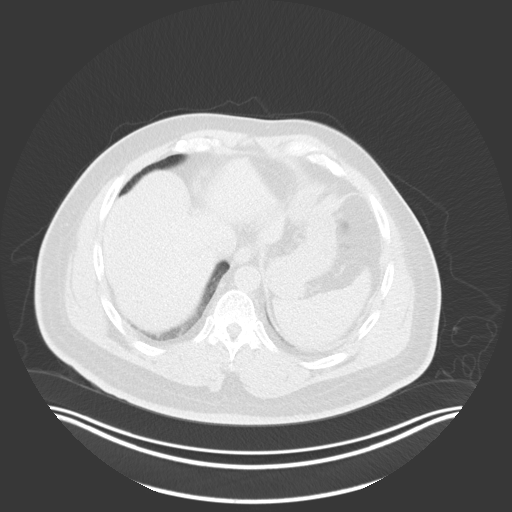

[2 of 25 positions shown; findings below may reference images not displayed]

Canned report from images found in remote index.

Refer to host system for actual result text.

## 2017-04-22 ENCOUNTER — Telehealth: Payer: Self-pay | Admitting: Oncology

## 2017-04-22 NOTE — Telephone Encounter (Signed)
Appt has been scheduled for the pt to see Dr. Alen Blew on 4/27 at 2pm. Pt aware to arrive 30 minutes early. Voiced understanding. Location given.

## 2017-04-23 ENCOUNTER — Ambulatory Visit: Payer: Medicare Other | Admitting: Hematology and Oncology

## 2017-04-25 ENCOUNTER — Ambulatory Visit (HOSPITAL_BASED_OUTPATIENT_CLINIC_OR_DEPARTMENT_OTHER): Payer: BLUE CROSS/BLUE SHIELD | Admitting: Oncology

## 2017-04-25 ENCOUNTER — Telehealth: Payer: Self-pay | Admitting: Oncology

## 2017-04-25 DIAGNOSIS — C439 Malignant melanoma of skin, unspecified: Secondary | ICD-10-CM | POA: Diagnosis not present

## 2017-04-25 DIAGNOSIS — C773 Secondary and unspecified malignant neoplasm of axilla and upper limb lymph nodes: Secondary | ICD-10-CM | POA: Diagnosis not present

## 2017-04-25 DIAGNOSIS — Z7189 Other specified counseling: Secondary | ICD-10-CM

## 2017-04-25 DIAGNOSIS — Z8546 Personal history of malignant neoplasm of prostate: Secondary | ICD-10-CM | POA: Diagnosis not present

## 2017-04-25 MED ORDER — PROCHLORPERAZINE MALEATE 10 MG PO TABS: 10.0000 mg | ORAL_TABLET | Freq: Four times a day (QID) | ORAL | 0 refills | Status: DC | PRN

## 2017-04-25 NOTE — Progress Notes (Signed)
Reason for Referral: Melanoma.   HPI: [name] is a 66 year old African-American gentleman currently lives in [place]. He is a gentleman with history of hypertension and prostate cancer and diagnosis of melanoma in 2016. His history of prostate cancer is unclear to me but clearly was diagnosed around 2008 and had primary surgical therapy. His Gleason score and PSA are unknown. He was found to have a tumor on his right lateral abdominal area in 2014. 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 He was observed for an extended period of time while he is getting his care at [facility]. He subsequently developed stage III disease with right axillary lymph node involvement in 2016. He underwent right axillary lymph node dissection in January 2017 with 1 out of 24 lymph node positive for metastatic melanoma. This was performed by Dr. Freddi Che. He remained on active surveillance since that time with follow-up with Dr. Clance Boll. Repeat imaging studies did show pulmonary nodule that remained stable around extended period of time. His most recent imaging studies in 2018 did not show any evidence of metastatic disease.  He developed a right axillary palpable nodule that was removed surgically on 03/18/2017. The pathology confirmed the presence of 1.2 cm metastatic melanoma with multiple nodules and focal interstitial infiltration in the subcutaneous fat with no necrosis. The margins were positive at the edge of the specimen. No tumor infiltrating lymphocytes noted. He was offered repeat surgical therapy and declined and the decision was to proceed with systemic therapy at this time. He prefers to have that done closer to home and he was referred to me for that reason.  Chronically, he is asymptomatic at this time. He does report some right upper extremity swelling related to his lymph node dissection. He still able to perform work-related duties without any hindrance or  decline. He denied any excessive fatigue or tiredness. He denied any constitutional symptoms.  He does not report any headaches, blurry vision, syncope or seizures. He does not report any fevers, chills, sweats or weight loss. He does not report any chest pain, palpitation, orthopnea or leg edema. He does not report any nausea, vomiting or abdominal pain. He does not report any constipation or diarrhea. He does not report any frequency urgency or hesitancy. Remaining review of systems unremarkable.    Current Outpatient Prescriptions:  .  hydrochlorothiazide (MICROZIDE) 12.5 MG capsule, Take 12.5 mg by mouth daily., Disp: , Rfl:  .  ibuprofen (ADVIL,MOTRIN) 800 MG tablet, Take 800 mg by mouth every 8 (eight) hours as needed for mild pain., Disp: , Rfl:  .  indomethacin (INDOCIN) 50 MG capsule, Take 50 mg by mouth 3 (three) times daily as needed., Disp: , Rfl:  .  metoprolol succinate (TOPROL-XL) 50 MG 24 hr tablet, Take 50 mg by mouth daily. Take with or immediately following a meal., Disp: , Rfl:  .  prochlorperazine (COMPAZINE) 10 MG tablet, Take 1 tablet (10 mg total) by mouth every 6 (six) hours as needed for nausea or vomiting., Disp: 30 tablet, Rfl: 0 .  zolpidem (AMBIEN CR) 6.25 MG CR tablet, Take 6.25 mg by mouth at bedtime as needed for sleep., Disp: , Rfl: :  Not on File:  No family history on file.:  Social History   Social History  . Marital status: Married    Spouse name: N/A  . Number of children: N/A  . Years of education: N/A   Occupational History  . Not on file.  Social History Main Topics  . Smoking status: Not on file  . Smokeless tobacco: Not on file  . Alcohol use Not on file  . Drug use: Unknown  . Sexual activity: Not on file   Other Topics Concern  . Not on file   Social History Narrative  . No narrative on file  :  Pertinent items are noted in HPI.  Exam: Blood pressure (!) 150/86, pulse 77, temperature 98.1 F (36.7 C), temperature source  Oral, resp. rate 20, height '5\' 8"'$  (1.727 m), weight 216 lb 8 oz (98.2 kg), SpO2 98 %. General appearance: alert and cooperative appeared without distress. Throat: No oral thrush or ulcers. Neck: no adenopathy Back: negative Resp: clear to auscultation bilaterally Cardio: regular rate and rhythm, S1, S2 normal, no murmur, click, rub or gallop GI: soft, non-tender; bowel sounds normal; no masses,  no organomegaly Extremities: extremities normal, atraumatic, no cyanosis. Right upper extremity edema noted. Pulses: 2+ and symmetric Skin: Skin color, texture, turgor normal. No rashes or lesions Lymph nodes: Cervical, supraclavicular, and axillary nodes normal. Right axillary examination revealed a well-healed scar without any erythema or induration. I cannot palpate any nodules.      Assessment and Plan:   66 year old gentleman with the following issues:  1. Metastatic melanoma diagnosed in March 2018. His initial diagnosis was in 2014 where he presented with a desmoplastic lesion on his right flank. He subsequently developed stage IIIc disease with right axillary lymph node involvement. He is status post axillary lymph node dissection completed in January 2017 with 1 out of 24 lymph nodes involved.  He developed regional metastatic disease with a axillary nodule that was resected on 03/18/2017 with residual tumor noted microscopically. He was offered repeat surgical therapy and he declined.  The rationale for using systemic therapy was discussed today in detail. Risks and benefits of Nivolumab immunotherapy was reviewed. Complications associated with this medication include nausea, fatigue, pneumonitis, dermatitis, hypothyroidism as well as other immune-related complications. He benefit will be disease control and decrease his risk of systemic metastasis. The infusion scheduled every 2 weeks for the time being until the every 4 week dose is fully approved.  After discussion in today's agreeable  to proceed after education class.  2. IV access: Peripheral veins will be used in his case although he might require a Port-A-Cath if this becomes an issue.  3. Immune mediated complications surveillance: He'll have TSH repeated periodically.  4. Genetic counseling: He does have an extensive family history of multiple malignancies. Women and his family including his mother and sisters have a history of breast cancer. He does have personal history of prostate cancer and melanoma. I offered him genetic counseling referral and he agreed.  5. Follow-up: Will be in immediate future to start therapy.

## 2017-04-25 NOTE — Progress Notes (Signed)
START ON PATHWAY REGIMEN - Melanoma     A cycle is every 14 days:     Nivolumab   **Always confirm dose/schedule in your pharmacy ordering system**    Patient Characteristics: Stage IV, Unresectable, Asymptomatic, First Line, BRAF V600 Wild Type / BRAF V600 Results Pending or Unknown Disease Subtype: Cutaneous Current Disease Status: Distant Metastases AJCC 8 Stage Grouping: Staged < 8th Ed. AJCC T Category: TX AJCC N Category: NX AJCC M Category: M1 Mutation Status: Did Not Order BRAF V600 Test Metastatic Disease Type: Asymptomatic Would you be surprised if this patient died  in the next year? I would be surprised if this patient died in the next year Line of Therapy: First Line  Intent of Therapy: Non-Curative / Palliative Intent, Discussed with Patient

## 2017-04-25 NOTE — Telephone Encounter (Signed)
Gave patient AVS and calender per 4/27 los.  

## 2017-05-02 ENCOUNTER — Other Ambulatory Visit: Payer: Medicare Other

## 2017-05-02 ENCOUNTER — Other Ambulatory Visit (HOSPITAL_BASED_OUTPATIENT_CLINIC_OR_DEPARTMENT_OTHER): Payer: BLUE CROSS/BLUE SHIELD

## 2017-05-02 ENCOUNTER — Ambulatory Visit (HOSPITAL_BASED_OUTPATIENT_CLINIC_OR_DEPARTMENT_OTHER): Payer: BLUE CROSS/BLUE SHIELD

## 2017-05-02 VITALS — BP 143/89 | HR 60 | Temp 98.0°F | Resp 18

## 2017-05-02 DIAGNOSIS — Z5112 Encounter for antineoplastic immunotherapy: Secondary | ICD-10-CM | POA: Diagnosis not present

## 2017-05-02 DIAGNOSIS — C439 Malignant melanoma of skin, unspecified: Secondary | ICD-10-CM | POA: Diagnosis not present

## 2017-05-02 DIAGNOSIS — Z7189 Other specified counseling: Secondary | ICD-10-CM

## 2017-05-02 LAB — CBC WITH DIFFERENTIAL/PLATELET
BASO%: 0.8 % (ref 0.0–2.0)
Basophils Absolute: 0 10*3/uL (ref 0.0–0.1)
EOS%: 5.2 % (ref 0.0–7.0)
Eosinophils Absolute: 0.2 10*3/uL (ref 0.0–0.5)
HCT: 36.4 % — ABNORMAL LOW (ref 38.4–49.9)
HGB: 13.1 g/dL (ref 13.0–17.1)
LYMPH%: 47.7 % (ref 14.0–49.0)
MCH: 30.8 pg (ref 27.2–33.4)
MCHC: 36 g/dL (ref 32.0–36.0)
MCV: 85.6 fL (ref 79.3–98.0)
MONO#: 0.5 10*3/uL (ref 0.1–0.9)
MONO%: 12.4 % (ref 0.0–14.0)
NEUT%: 33.9 % — ABNORMAL LOW (ref 39.0–75.0)
NEUTROS ABS: 1.3 10*3/uL — AB (ref 1.5–6.5)
Platelets: 282 10*3/uL (ref 140–400)
RBC: 4.25 10*6/uL (ref 4.20–5.82)
RDW: 12.8 % (ref 11.0–14.6)
WBC: 3.9 10*3/uL — AB (ref 4.0–10.3)
lymph#: 1.9 10*3/uL (ref 0.9–3.3)

## 2017-05-02 LAB — COMPREHENSIVE METABOLIC PANEL
ALT: 29 U/L (ref 0–55)
ANION GAP: 12 meq/L — AB (ref 3–11)
AST: 29 U/L (ref 5–34)
Albumin: 4.2 g/dL (ref 3.5–5.0)
Alkaline Phosphatase: 66 U/L (ref 40–150)
BUN: 13.7 mg/dL (ref 7.0–26.0)
CHLORIDE: 105 meq/L (ref 98–109)
CO2: 25 meq/L (ref 22–29)
CREATININE: 1.2 mg/dL (ref 0.7–1.3)
Calcium: 9.1 mg/dL (ref 8.4–10.4)
EGFR: 70 mL/min/{1.73_m2} — AB (ref >90)
Glucose: 89 mg/dl (ref 70–140)
Potassium: 3.4 mEq/L — ABNORMAL LOW (ref 3.5–5.1)
SODIUM: 143 meq/L (ref 136–145)
TOTAL PROTEIN: 7.4 g/dL (ref 6.4–8.3)
Total Bilirubin: 1.23 mg/dL — ABNORMAL HIGH (ref 0.20–1.20)

## 2017-05-02 MED ORDER — SODIUM CHLORIDE 0.9 % IV SOLN
240.0000 mg | Freq: Once | INTRAVENOUS | Status: AC
  Administered 2017-05-02: 240 mg via INTRAVENOUS
  Filled 2017-05-02: qty 24

## 2017-05-02 MED ORDER — SODIUM CHLORIDE 0.9 % IV SOLN
Freq: Once | INTRAVENOUS | Status: AC
  Administered 2017-05-02: 13:00:00 via INTRAVENOUS

## 2017-05-02 NOTE — Patient Instructions (Addendum)
[facility] Discharge Instructions for Patients Receiving Chemotherapy  Today you received the following chemotherapy agents Nivolumab  To help prevent nausea and vomiting after your treatment, we encourage you to take your nausea medication as directed.    If you develop nausea and vomiting that is not controlled by your nausea medication, call the clinic.   BELOW ARE SYMPTOMS THAT SHOULD BE REPORTED IMMEDIATELY:  *FEVER GREATER THAN 100.5 F  *CHILLS WITH OR WITHOUT FEVER  NAUSEA AND VOMITING THAT IS NOT CONTROLLED WITH YOUR NAUSEA MEDICATION  *UNUSUAL SHORTNESS OF BREATH  *UNUSUAL BRUISING OR BLEEDING  TENDERNESS IN MOUTH AND THROAT WITH OR WITHOUT PRESENCE OF ULCERS  *URINARY PROBLEMS  *BOWEL PROBLEMS  UNUSUAL RASH Items with * indicate a potential emergency and should be followed up as soon as possible.  Feel free to call the clinic you have any questions or concerns. The clinic phone number is [phone].   Hypokalemia Hypokalemia means that the amount of potassium in the blood is lower than normal.Potassium is a chemical that helps regulate the amount of fluid in the body (electrolyte). It also stimulates muscle tightening (contraction) and helps nerves work properly.Normally, most of the body's potassium is inside of cells, and only a very small amount is in the blood. Because the amount in the blood is so small, minor changes to potassium levels in the blood can be life-threatening. What are the causes? This condition may be caused by:  Antibiotic medicine.  Diarrhea or vomiting. Taking too much of a medicine that helps you have a bowel movement (laxative) can cause diarrhea and lead to hypokalemia.  Chronic kidney disease (CKD).  Medicines that help the body get rid of excess fluid (diuretics).  Eating disorders, such as bulimia.  Low magnesium levels in the body.  Sweating a lot. What are the signs or symptoms? Symptoms of this  condition include:  Weakness.  Constipation.  Fatigue.  Muscle cramps.  Mental confusion.  Skipped heartbeats or irregular heartbeat (palpitations).  Tingling or numbness. How is this diagnosed? This condition is diagnosed with a blood test. How is this treated? Hypokalemia can be treated by taking potassium supplements by mouth or adjusting the medicines that you take. Treatment may also include eating more foods that contain a lot of potassium. If your potassium level is very low, you may need to get potassium through an IV tube in one of your veins and be monitored in the hospital. Follow these instructions at home:  Take over-the-counter and prescription medicines only as told by your health care provider. This includes vitamins and supplements.  Eat a healthy diet. A healthy diet includes fresh fruits and vegetables, whole grains, healthy fats, and lean proteins.  If instructed, eat more foods that contain a lot of potassium, such as:  Nuts, such as peanuts and pistachios.  Seeds, such as sunflower seeds and pumpkin seeds.  Peas, lentils, and lima beans.  Whole grain and bran cereals and breads.  Fresh fruits and vegetables, such as apricots, avocado, bananas, cantaloupe, kiwi, oranges, tomatoes, asparagus, and potatoes.  Orange juice.  Tomato juice.  Red meats.  Yogurt.  Keep all follow-up visits as told by your health care provider. This is important. Contact a health care provider if:  You have weakness that gets worse.  You feel your heart pounding or racing.  You vomit.  You have diarrhea.  You have diabetes (diabetes mellitus) and you have trouble keeping your blood sugar (glucose) in your target range.  Get help right away if:  You have chest pain.  You have shortness of breath.  You have vomiting or diarrhea that lasts for more than 2 days.  You faint. This information is not intended to replace advice given to you by your health care  provider. Make sure you discuss any questions you have with your health care provider. Document Released: 12/16/2005 Document Revised: 08/03/2016 Document Reviewed: 08/03/2016 Elsevier Interactive Patient Education  2017 [place] injection What is this medicine? NIVOLUMAB (nye VOL ue mab) is a monoclonal antibody. It is used to treat melanoma, lung cancer, kidney cancer, head and neck cancer, Hodgkin lymphoma, urothelial cancer, colon cancer, and liver cancer. This medicine may be used for other purposes; ask your health care provider or pharmacist if you have questions. COMMON BRAND NAME(S): Opdivo What should I tell my health care provider before I take this medicine? They need to know if you have any of these conditions: -diabetes -immune system problems -kidney disease -liver disease -lung disease -organ transplant -stomach or intestine problems -thyroid disease -an unusual or allergic reaction to nivolumab, other medicines, foods, dyes, or preservatives -pregnant or trying to get pregnant -breast-feeding How should I use this medicine? This medicine is for infusion into a vein. It is given by a health care professional in a hospital or clinic setting. A special MedGuide will be given to you before each treatment. Be sure to read this information carefully each time. Talk to your pediatrician regarding the use of this medicine in children. While this drug may be prescribed for children as Gores as 12 years for selected conditions, precautions do apply. Overdosage: If you think you have taken too much of this medicine contact a poison control center or emergency room at once. NOTE: This medicine is only for you. Do not share this medicine with others. What if I miss a dose? It is important not to miss your dose. Call your doctor or health care professional if you are unable to keep an appointment. What may interact with this medicine? Interactions have not been  studied. Give your health care provider a list of all the medicines, herbs, non-prescription drugs, or dietary supplements you use. Also tell them if you smoke, drink alcohol, or use illegal drugs. Some items may interact with your medicine. This list may not describe all possible interactions. Give your health care provider a list of all the medicines, herbs, non-prescription drugs, or dietary supplements you use. Also tell them if you smoke, drink alcohol, or use illegal drugs. Some items may interact with your medicine. What should I watch for while using this medicine? This drug may make you feel generally unwell. Continue your course of treatment even though you feel ill unless your doctor tells you to stop. You may need blood work done while you are taking this medicine. Do not become pregnant while taking this medicine or for 5 months after stopping it. Women should inform their doctor if they wish to become pregnant or think they might be pregnant. There is a potential for serious side effects to an unborn child. Talk to your health care professional or pharmacist for more information. Do not breast-feed an infant while taking this medicine. What side effects may I notice from receiving this medicine? Side effects that you should report to your doctor or health care professional as soon as possible: -allergic reactions like skin rash, itching or hives, swelling of the face, lips, or tongue -black, tarry stools -blood in  the urine -bloody or watery diarrhea -changes in vision -change in sex drive -changes in emotions or moods -chest pain -confusion -cough -decreased appetite -diarrhea -facial flushing -feeling faint or lightheaded -fever, chills -hair loss -hallucination, loss of contact with reality -headache -irritable -joint pain -loss of memory -muscle pain -muscle weakness -seizures -shortness of breath -signs and symptoms of high blood sugar such as dizziness; dry  mouth; dry skin; fruity breath; nausea; stomach pain; increased hunger or thirst; increased urination -signs and symptoms of kidney injury like trouble passing urine or change in the amount of urine -signs and symptoms of liver injury like dark yellow or brown urine; general ill feeling or flu-like symptoms; light-colored stools; loss of appetite; nausea; right upper belly pain; unusually weak or tired; yellowing of the eyes or skin -stiff neck -swelling of the ankles, feet, hands -weight gain Side effects that usually do not require medical attention (report to your doctor or health care professional if they continue or are bothersome): -bone pain -constipation -tiredness -vomiting This list may not describe all possible side effects. Call your doctor for medical advice about side effects. You may report side effects to FDA at 1-800-FDA-1088. Where should I keep my medicine? This drug is given in a hospital or clinic and will not be stored at home. NOTE: This sheet is a summary. It may not cover all possible information. If you have questions about this medicine, talk to your doctor, pharmacist, or health care provider.  2018 Elsevier/Gold Standard (2016-09-23 17:49:34)

## 2017-05-15 ENCOUNTER — Telehealth: Payer: Self-pay | Admitting: Oncology

## 2017-05-15 ENCOUNTER — Encounter: Payer: Medicare Other | Admitting: Genetics

## 2017-05-15 NOTE — Telephone Encounter (Signed)
R/s Genetics appt per patient request - patient called [name] to see if he could reschedule - left message with appt date and time

## 2017-05-16 ENCOUNTER — Telehealth: Payer: Self-pay | Admitting: Oncology

## 2017-05-16 ENCOUNTER — Ambulatory Visit (HOSPITAL_BASED_OUTPATIENT_CLINIC_OR_DEPARTMENT_OTHER): Payer: BLUE CROSS/BLUE SHIELD

## 2017-05-16 ENCOUNTER — Other Ambulatory Visit (HOSPITAL_BASED_OUTPATIENT_CLINIC_OR_DEPARTMENT_OTHER): Payer: BLUE CROSS/BLUE SHIELD

## 2017-05-16 ENCOUNTER — Ambulatory Visit (HOSPITAL_BASED_OUTPATIENT_CLINIC_OR_DEPARTMENT_OTHER): Payer: BLUE CROSS/BLUE SHIELD | Admitting: Oncology

## 2017-05-16 VITALS — BP 149/88 | HR 64 | Temp 98.4°F | Resp 18 | Ht 68.0 in | Wt 215.1 lb

## 2017-05-16 DIAGNOSIS — C439 Malignant melanoma of skin, unspecified: Secondary | ICD-10-CM

## 2017-05-16 DIAGNOSIS — Z5112 Encounter for antineoplastic immunotherapy: Secondary | ICD-10-CM | POA: Diagnosis not present

## 2017-05-16 DIAGNOSIS — Z79899 Other long term (current) drug therapy: Secondary | ICD-10-CM | POA: Diagnosis not present

## 2017-05-16 DIAGNOSIS — Z8546 Personal history of malignant neoplasm of prostate: Secondary | ICD-10-CM

## 2017-05-16 DIAGNOSIS — C773 Secondary and unspecified malignant neoplasm of axilla and upper limb lymph nodes: Secondary | ICD-10-CM | POA: Diagnosis not present

## 2017-05-16 DIAGNOSIS — Z7189 Other specified counseling: Secondary | ICD-10-CM

## 2017-05-16 LAB — COMPREHENSIVE METABOLIC PANEL
ALT: 20 U/L (ref 0–55)
ANION GAP: 11 meq/L (ref 3–11)
AST: 24 U/L (ref 5–34)
Albumin: 4.3 g/dL (ref 3.5–5.0)
Alkaline Phosphatase: 67 U/L (ref 40–150)
BUN: 7.8 mg/dL (ref 7.0–26.0)
CHLORIDE: 103 meq/L (ref 98–109)
CO2: 25 meq/L (ref 22–29)
Calcium: 9.1 mg/dL (ref 8.4–10.4)
Creatinine: 1.3 mg/dL (ref 0.7–1.3)
EGFR: 68 mL/min/{1.73_m2} — AB (ref >90)
Glucose: 82 mg/dl (ref 70–140)
Potassium: 3.4 mEq/L — ABNORMAL LOW (ref 3.5–5.1)
Sodium: 139 mEq/L (ref 136–145)
Total Bilirubin: 1.23 mg/dL — ABNORMAL HIGH (ref 0.20–1.20)
Total Protein: 7.8 g/dL (ref 6.4–8.3)

## 2017-05-16 LAB — TSH: TSH: 1.725 m(IU)/L (ref 0.320–4.118)

## 2017-05-16 LAB — CBC WITH DIFFERENTIAL/PLATELET
BASO%: 0.3 % (ref 0.0–2.0)
Basophils Absolute: 0 10*3/uL (ref 0.0–0.1)
EOS%: 5.7 % (ref 0.0–7.0)
Eosinophils Absolute: 0.3 10*3/uL (ref 0.0–0.5)
HEMATOCRIT: 36.5 % — AB (ref 38.4–49.9)
HEMOGLOBIN: 12.9 g/dL — AB (ref 13.0–17.1)
LYMPH#: 2.3 10*3/uL (ref 0.9–3.3)
LYMPH%: 49.4 % — ABNORMAL HIGH (ref 14.0–49.0)
MCH: 30.6 pg (ref 27.2–33.4)
MCHC: 35.3 g/dL (ref 32.0–36.0)
MCV: 86.8 fL (ref 79.3–98.0)
MONO#: 0.5 10*3/uL (ref 0.1–0.9)
MONO%: 11.4 % (ref 0.0–14.0)
NEUT#: 1.5 10*3/uL (ref 1.5–6.5)
NEUT%: 33.2 % — ABNORMAL LOW (ref 39.0–75.0)
Platelets: 346 10*3/uL (ref 140–400)
RBC: 4.2 10*6/uL (ref 4.20–5.82)
RDW: 13.3 % (ref 11.0–14.6)
WBC: 4.6 10*3/uL (ref 4.0–10.3)

## 2017-05-16 MED ORDER — SODIUM CHLORIDE 0.9 % IV SOLN
240.0000 mg | Freq: Once | INTRAVENOUS | Status: AC
  Administered 2017-05-16: 240 mg via INTRAVENOUS
  Filled 2017-05-16: qty 24

## 2017-05-16 MED ORDER — SODIUM CHLORIDE 0.9 % IV SOLN
Freq: Once | INTRAVENOUS | Status: AC
  Administered 2017-05-16: 15:00:00 via INTRAVENOUS

## 2017-05-16 NOTE — Telephone Encounter (Signed)
Gave patient AVS and calender per 5/18 los.

## 2017-05-16 NOTE — Progress Notes (Signed)
Hematology and Oncology Follow Up Visit  William Wilkerson [id number] December 06, 1951 66 y.o. 05/16/2017 1:29 PM William Wilkerson, MDNo ref. provider found   Principle Diagnosis: 67 year old gentleman with metastatic melanoma initially diagnosed in 2014 with a flank lesion. His initial pathology showed desmoplastic subtype. He developed a stage IIIc subsequently and currently has metastatic disease with recurrence in the right axilla.   Prior Therapy:  He was found to have a tumor on his right lateral abdominal area in 2014. 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 He was observed for an extended period of time while he is getting his care at [facility].   He subsequently developed stage III disease with right axillary lymph node involvement in 2016. He underwent right axillary lymph node dissection in January 2017 with 1 out of 24 lymph node positive for metastatic melanoma. This was performed by Dr. Freddi Che. He remained on active surveillance since that time with follow-up with Dr. Clance Boll.   Repeat imaging studies did show pulmonary nodule that remained stable around extended period of time. His most recent imaging studies in 2018 did not show any evidence of metastatic disease.  He developed a palpable nodule in the axilla and in March 2018. This was surgically resected with residual tumor at that time.  Current therapy:  Nivolumab 240 mg every 2 weeks started on 05/02/2017. He is here for cycle 2 of therapy.  Interim History: [name] presents today for a follow-up visit. Since last visit, he reports no changes in his health. He received the first treatment of Nivolumab have tolerated it well. He denied any infusion-related complications, arthralgias or myalgias. He denied any pruritus or skin rash. He does report mild fatigue but not affecting his ability to work and attends to activities of daily living. He denied any lymphadenopathy or masses.  He does not report any  headaches, blurry vision, syncope or seizures. He does not report any fevers, chills, sweats or weight loss. He does not report any chest pain, palpitation, orthopnea or leg edema. He does not report any nausea, vomiting or abdominal pain. He does not report any constipation or diarrhea. He does not report any frequency urgency or hesitancy. Remaining review of systems unremarkable  Medications: I have reviewed the patient's current medications.  Current Outpatient Prescriptions  Medication Sig Dispense Refill  . hydrochlorothiazide (MICROZIDE) 12.5 MG capsule Take 12.5 mg by mouth daily.    Marland Kitchen ibuprofen (ADVIL,MOTRIN) 800 MG tablet Take 800 mg by mouth every 8 (eight) hours as needed for mild pain.    . indomethacin (INDOCIN) 50 MG capsule Take 50 mg by mouth 3 (three) times daily as needed.    . metoprolol succinate (TOPROL-XL) 50 MG 24 hr tablet Take 50 mg by mouth daily. Take with or immediately following a meal.    . prochlorperazine (COMPAZINE) 10 MG tablet Take 1 tablet (10 mg total) by mouth every 6 (six) hours as needed for nausea or vomiting. 30 tablet 0  . zolpidem (AMBIEN CR) 6.25 MG CR tablet Take 6.25 mg by mouth at bedtime as needed for sleep.     No current facility-administered medications for this visit.      Allergies:  Allergies  Allergen Reactions  . Codeine Other (See Comments)    "Makes me crazy"  . Hydromorphone Anxiety  . Morphine Other (See Comments)    "makes me crazy"  . Percocet [Oxycodone-Acetaminophen] Other (See Comments)    Narcotic medications make him "feel weird" and  anxious    Past Medical History, Surgical history, Social history, and Family History were reviewed and updated.  Physical Exam: Blood pressure (!) 149/88, pulse 64, temperature 98.4 F (36.9 C), temperature source Oral, resp. rate 18, height '5\' 8"'$  (1.727 m), weight 215 lb 1.6 oz (97.6 kg), SpO2 100 %. ECOG: 0 General appearance: alert and cooperative appeared without distress. Head:  Normocephalic, without obvious abnormality no oral ulcers or lesions Neck: no adenopathy Lymph nodes: Cervical, supraclavicular, and axillary nodes normal. No masses palpated under his right axilla. Heart:regular rate and rhythm, S1, S2 normal, no murmur, click, rub or gallop Lung:chest clear, no wheezing, rales, normal symmetric air entry. Abdomin: soft, non-tender, without masses or organomegaly EXT:no erythema, induration, or nodules   Lab Results: Lab Results  Component Value Date   WBC 4.6 05/16/2017   HGB 12.9 (L) 05/16/2017   HCT 36.5 (L) 05/16/2017   MCV 86.8 05/16/2017   PLT 346 05/16/2017     Chemistry      Component Value Date/Time   NA 143 05/02/2017 0912   K 3.4 (L) 05/02/2017 0912   CO2 25 05/02/2017 0912   BUN 13.7 05/02/2017 0912   CREATININE 1.2 05/02/2017 0912      Component Value Date/Time   CALCIUM 9.1 05/02/2017 0912   ALKPHOS 66 05/02/2017 0912   AST 29 05/02/2017 0912   ALT 29 05/02/2017 0912   BILITOT 1.23 (H) 05/02/2017 0912      Impression and Plan:  66 year old gentleman with the following issues:  1. Metastatic melanoma diagnosed in March 2018. His initial diagnosis was in 2014 where he presented with a desmoplastic lesion on his right flank. He subsequently developed stage IIIc disease with right axillary lymph node involvement. He is status post axillary lymph node dissection completed in January 2017 with 1 out of 24 lymph nodes involved.  He developed regional metastatic disease with a axillary nodule that was resected on 03/18/2017 with residual tumor noted microscopically. He was offered repeat surgical therapy and he declined.  He is currently receiving systemic therapy with Nivolumab and have tolerated treatment well so far. The plan is to continue with this treatment every 2 weeks and likely repeat imaging studies in 2-3 months. He will obtain these imaging studies at [facility]  2. IV access: Risks and benefits of Port-A-Cath  insertion was discussed today. Complications include bleeding, thrombosis or infection were reviewed although considered less likely. He'll consider this option and for the time being we'll use peripheral veins.  3. Immune mediated complications surveillance: TSH repeated periodically.  4. Genetic counseling: He does have an extensive family history of multiple malignancies. Women and his family including his mother and sisters have a history of breast cancer. He does have personal history of prostate cancer and melanoma. He has a poorly genetic counselors in the near future.  5. Follow-up: Will be in 2 weeks the procedure next cycle of therapy.  Zola Button, MD 5/18/20181:29 PM

## 2017-05-16 NOTE — Patient Instructions (Signed)
[facility] Discharge Instructions for Patients Receiving Chemotherapy  Today you received the following chemotherapy agents Nivolumab  To help prevent nausea and vomiting after your treatment, we encourage you to take your nausea medication as directed.    If you develop nausea and vomiting that is not controlled by your nausea medication, call the clinic.   BELOW ARE SYMPTOMS THAT SHOULD BE REPORTED IMMEDIATELY:  *FEVER GREATER THAN 100.5 F  *CHILLS WITH OR WITHOUT FEVER  NAUSEA AND VOMITING THAT IS NOT CONTROLLED WITH YOUR NAUSEA MEDICATION  *UNUSUAL SHORTNESS OF BREATH  *UNUSUAL BRUISING OR BLEEDING  TENDERNESS IN MOUTH AND THROAT WITH OR WITHOUT PRESENCE OF ULCERS  *URINARY PROBLEMS  *BOWEL PROBLEMS  UNUSUAL RASH Items with * indicate a potential emergency and should be followed up as soon as possible.  Feel free to call the clinic you have any questions or concerns. The clinic phone number is [phone].   Hypokalemia Hypokalemia means that the amount of potassium in the blood is lower than normal.Potassium is a chemical that helps regulate the amount of fluid in the body (electrolyte). It also stimulates muscle tightening (contraction) and helps nerves work properly.Normally, most of the body's potassium is inside of cells, and only a very small amount is in the blood. Because the amount in the blood is so small, minor changes to potassium levels in the blood can be life-threatening. What are the causes? This condition may be caused by:  Antibiotic medicine.  Diarrhea or vomiting. Taking too much of a medicine that helps you have a bowel movement (laxative) can cause diarrhea and lead to hypokalemia.  Chronic kidney disease (CKD).  Medicines that help the body get rid of excess fluid (diuretics).  Eating disorders, such as bulimia.  Low magnesium levels in the body.  Sweating a lot. What are the signs or symptoms? Symptoms of this  condition include:  Weakness.  Constipation.  Fatigue.  Muscle cramps.  Mental confusion.  Skipped heartbeats or irregular heartbeat (palpitations).  Tingling or numbness. How is this diagnosed? This condition is diagnosed with a blood test. How is this treated? Hypokalemia can be treated by taking potassium supplements by mouth or adjusting the medicines that you take. Treatment may also include eating more foods that contain a lot of potassium. If your potassium level is very low, you may need to get potassium through an IV tube in one of your veins and be monitored in the hospital. Follow these instructions at home:  Take over-the-counter and prescription medicines only as told by your health care provider. This includes vitamins and supplements.  Eat a healthy diet. A healthy diet includes fresh fruits and vegetables, whole grains, healthy fats, and lean proteins.  If instructed, eat more foods that contain a lot of potassium, such as:  Nuts, such as peanuts and pistachios.  Seeds, such as sunflower seeds and pumpkin seeds.  Peas, lentils, and lima beans.  Whole grain and bran cereals and breads.  Fresh fruits and vegetables, such as apricots, avocado, bananas, cantaloupe, kiwi, oranges, tomatoes, asparagus, and potatoes.  Orange juice.  Tomato juice.  Red meats.  Yogurt.  Keep all follow-up visits as told by your health care provider. This is important. Contact a health care provider if:  You have weakness that gets worse.  You feel your heart pounding or racing.  You vomit.  You have diarrhea.  You have diabetes (diabetes mellitus) and you have trouble keeping your blood sugar (glucose) in your target range.  Get help right away if:  You have chest pain.  You have shortness of breath.  You have vomiting or diarrhea that lasts for more than 2 days.  You faint. This information is not intended to replace advice given to you by your health care  provider. Make sure you discuss any questions you have with your health care provider. Document Released: 12/16/2005 Document Revised: 08/03/2016 Document Reviewed: 08/03/2016 Elsevier Interactive Patient Education  2017 [place] [employer] What is this medicine? NIVOLUMAB (nye VOL ue mab) is a monoclonal antibody. It is used to treat melanoma, lung cancer, kidney cancer, head and neck cancer, Hodgkin lymphoma, urothelial cancer, colon cancer, and liver cancer. This medicine may be used for other purposes; ask your health care provider or pharmacist if you have questions. COMMON BRAND NAME(S): Opdivo What should I tell my health care provider before I take this medicine? They need to know if you have any of these conditions: -diabetes -immune system problems -kidney disease -liver disease -lung disease -organ transplant -stomach or intestine problems -thyroid disease -an unusual or allergic reaction to nivolumab, other medicines, foods, dyes, or preservatives -pregnant or trying to get pregnant -breast-feeding How should I use this medicine? This medicine is for infusion into a vein. It is given by a health care professional in a hospital or clinic setting. A special MedGuide will be given to you before each treatment. Be sure to read this information carefully each time. Talk to your pediatrician regarding the use of this medicine in children. While this drug may be prescribed for children as Daidone as 12 years for selected conditions, precautions do apply. Overdosage: If you think you have taken too much of this medicine contact a poison control center or emergency room at once. NOTE: This medicine is only for you. Do not share this medicine with others. What if I miss a dose? It is important not to miss your dose. Call your doctor or health care professional if you are unable to keep an appointment. What may interact with this medicine? Interactions have not been  studied. Give your health care provider a list of all the medicines, herbs, non-prescription drugs, or dietary supplements you use. Also tell them if you smoke, drink alcohol, or use illegal drugs. Some items may interact with your medicine. This list may not describe all possible interactions. Give your health care provider a list of all the medicines, herbs, non-prescription drugs, or dietary supplements you use. Also tell them if you smoke, drink alcohol, or use illegal drugs. Some items may interact with your medicine. What should I watch for while using this medicine? This drug may make you feel generally unwell. Continue your course of treatment even though you feel ill unless your doctor tells you to stop. You may need blood work done while you are taking this medicine. Do not become pregnant while taking this medicine or for 5 months after stopping it. Women should inform their doctor if they wish to become pregnant or think they might be pregnant. There is a potential for serious side effects to an unborn child. Talk to your health care professional or pharmacist for more information. Do not breast-feed an infant while taking this medicine. What side effects may I notice from receiving this medicine? Side effects that you should report to your doctor or health care professional as soon as possible: -allergic reactions like skin rash, itching or hives, swelling of the face, lips, or tongue -black, tarry stools -blood in  the urine -bloody or watery diarrhea -changes in vision -change in sex drive -changes in emotions or moods -chest pain -confusion -cough -decreased appetite -diarrhea -facial flushing -feeling faint or lightheaded -fever, chills -hair loss -hallucination, loss of contact with reality -headache -irritable -joint pain -loss of memory -muscle pain -muscle weakness -seizures -shortness of breath -signs and symptoms of high blood sugar such as dizziness; dry  mouth; dry skin; fruity breath; nausea; stomach pain; increased hunger or thirst; increased urination -signs and symptoms of kidney injury like trouble passing urine or change in the amount of urine -signs and symptoms of liver injury like dark yellow or brown urine; general ill feeling or flu-like symptoms; light-colored stools; loss of appetite; nausea; right upper belly pain; unusually weak or tired; yellowing of the eyes or skin -stiff neck -swelling of the ankles, feet, hands -weight gain Side effects that usually do not require medical attention (report to your doctor or health care professional if they continue or are bothersome): -bone pain -constipation -tiredness -vomiting This list may not describe all possible side effects. Call your doctor for medical advice about side effects. You may report side effects to FDA at 1-800-FDA-1088. Where should I keep my medicine? This drug is given in a hospital or clinic and will not be stored at home. NOTE: This sheet is a summary. It may not cover all possible information. If you have questions about this medicine, talk to your doctor, pharmacist, or health care provider.  2018 Elsevier/Gold Standard (2016-09-23 17:49:34)

## 2017-05-19 ENCOUNTER — Telehealth: Payer: Self-pay | Admitting: *Deleted

## 2017-05-19 NOTE — Telephone Encounter (Signed)
Patient calling to let dr Alen Blew know that he would like to have a port placed.

## 2017-05-20 ENCOUNTER — Other Ambulatory Visit: Payer: Self-pay | Admitting: Oncology

## 2017-05-20 DIAGNOSIS — C439 Malignant melanoma of skin, unspecified: Secondary | ICD-10-CM

## 2017-05-20 NOTE — Telephone Encounter (Signed)
Request to IR sent.

## 2017-05-22 ENCOUNTER — Other Ambulatory Visit: Payer: Self-pay | Admitting: Radiology

## 2017-05-27 ENCOUNTER — Other Ambulatory Visit: Payer: Self-pay | Admitting: Oncology

## 2017-05-27 ENCOUNTER — Encounter (HOSPITAL_COMMUNITY): Payer: Self-pay

## 2017-05-27 ENCOUNTER — Ambulatory Visit (HOSPITAL_COMMUNITY)
Admission: RE | Admit: 2017-05-27 | Discharge: 2017-05-27 | Disposition: A | Discharge status: Home / Self Care | Payer: BLUE CROSS/BLUE SHIELD | Source: Ambulatory Visit | Attending: Oncology | Admitting: Oncology

## 2017-05-27 DIAGNOSIS — Z8546 Personal history of malignant neoplasm of prostate: Secondary | ICD-10-CM | POA: Insufficient documentation

## 2017-05-27 DIAGNOSIS — Z803 Family history of malignant neoplasm of breast: Secondary | ICD-10-CM | POA: Diagnosis not present

## 2017-05-27 DIAGNOSIS — I10 Essential (primary) hypertension: Secondary | ICD-10-CM | POA: Insufficient documentation

## 2017-05-27 DIAGNOSIS — C792 Secondary malignant neoplasm of skin: Secondary | ICD-10-CM | POA: Diagnosis not present

## 2017-05-27 DIAGNOSIS — C439 Malignant melanoma of skin, unspecified: Secondary | ICD-10-CM

## 2017-05-27 HISTORY — PX: IR US GUIDE VASC ACCESS RIGHT: IMG2390

## 2017-05-27 HISTORY — DX: Essential (primary) hypertension: I10

## 2017-05-27 HISTORY — PX: IR FLUORO GUIDE PORT INSERTION RIGHT: IMG5741

## 2017-05-27 HISTORY — DX: Malignant melanoma of skin, unspecified: C43.9

## 2017-05-27 HISTORY — DX: Malignant (primary) neoplasm, unspecified: C80.1

## 2017-05-27 LAB — CBC WITH DIFFERENTIAL/PLATELET
Basophils Absolute: 0 10*3/uL (ref 0.0–0.1)
Basophils Relative: 1 %
EOS ABS: 0.2 10*3/uL (ref 0.0–0.7)
EOS PCT: 4 %
HEMATOCRIT: 37.5 % — AB (ref 39.0–52.0)
Hemoglobin: 13.4 g/dL (ref 13.0–17.0)
Lymphocytes Relative: 45 %
Lymphs Abs: 1.9 10*3/uL (ref 0.7–4.0)
MCH: 30.4 pg (ref 26.0–34.0)
MCHC: 35.7 g/dL (ref 30.0–36.0)
MCV: 85 fL (ref 78.0–100.0)
MONO ABS: 0.5 10*3/uL (ref 0.1–1.0)
MONOS PCT: 12 %
NEUTROS ABS: 1.6 10*3/uL — AB (ref 1.7–7.7)
Neutrophils Relative %: 38 %
PLATELETS: 315 10*3/uL (ref 150–400)
RBC: 4.41 MIL/uL (ref 4.22–5.81)
RDW: 12.6 % (ref 11.5–15.5)
WBC: 4.2 10*3/uL (ref 4.0–10.5)

## 2017-05-27 LAB — PROTIME-INR
INR: 0.99
Prothrombin Time: 13.1 seconds (ref 11.4–15.2)

## 2017-05-27 IMAGING — US IR FLUORO GUIDE CV LINE*R*
1 series · 1 of 1 positions shown · non-contrast
Comparison: none

CLINICAL DATA: Metastatic melanoma, access for chemotherapy

[Series 1: ir fluoro/shunt/fist · 1 of 1 slices shown]
[im 1/1]
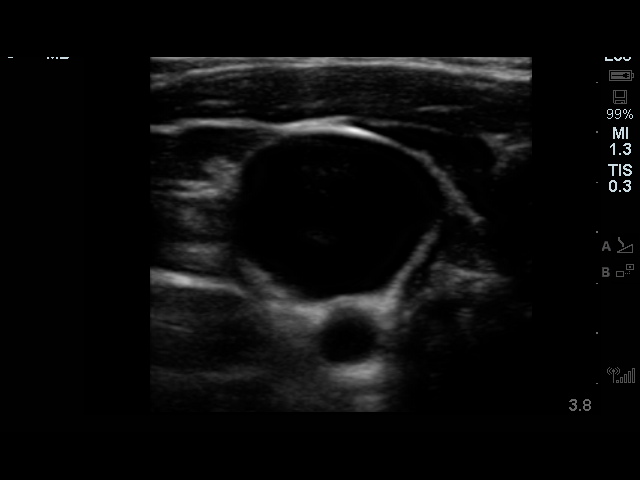

[1 of 1 positions shown; findings below may reference images not displayed]

EXAM:
LEFT INTERNAL JUGULAR SINGLE LUMEN POWER PORT CATHETER INSERTION

Date:  [DATE] [DATE]

Radiologist:  SHY

Guidance:  Ultrasound and fluoroscopic

MEDICATIONS:
Ancef 2 g; The antibiotic was administered within an appropriate
time interval prior to skin puncture.

ANESTHESIA/SEDATION:
Versed 4.0 mg IV; Fentanyl 100 mcg IV;

Moderate Sedation Time:  30 minutes

The patient was continuously monitored during the procedure by the
interventional radiology nurse under my direct supervision.

FLUOROSCOPY TIME:  2.0 minutes, 6 seconds (23 mGy)

COMPLICATIONS:
None immediate.

CONTRAST:  None.

PROCEDURE:
Informed consent was obtained from the patient following explanation
of the procedure, risks, benefits and alternatives. The patient
understands, agrees and consents for the procedure. All questions
were addressed. A time out was performed.

Maximal barrier sterile technique utilized including caps, mask,
sterile gowns, sterile gloves, large sterile drape, hand hygiene,
and 2% chlorhexidine scrub.

Under sterile conditions and local anesthesia, left internal jugular
micropuncture venous access was performed. Access was performed with
ultrasound. Images were obtained for documentation. A guide wire was
inserted followed by a transitional dilator. This allowed insertion
of a guide wire and catheter into the IVC. Measurements were
obtained from the SVC / RA junction back to the left IJ venotomy
site. In the left infraclavicular chest, a subcutaneous pocket was
created over the second anterior rib. This was done under sterile
conditions and local anesthesia. 1% lidocaine with epinephrine was
utilized for this. A 2.5 cm incision was made in the skin. Blunt
dissection was performed to create a subcutaneous pocket over the
right pectoralis major muscle. The pocket was flushed with saline
vigorously. There was adequate hemostasis. The port catheter was
assembled and checked for leakage. The port catheter was secured in
the pocket with two retention sutures. The tubing was tunneled
subcutaneously to the left venotomy site and inserted into the
SVC/RA junction through a valved peel-away sheath. Position was
confirmed with fluoroscopy. Images were obtained for documentation.
The patient tolerated the procedure well. No immediate
complications. Incisions were closed in a two layer fashion with 4 -
0 Vicryl suture. Dermabond was applied to the skin. The port
catheter was accessed, blood was aspirated followed by saline and
heparin flushes. Needle was removed. A dry sterile dressing was
applied.
IMPRESSION: Ultrasound and fluoroscopically guided left internal jugular single
lumen power port catheter insertion. Tip in the SVC/RA junction.
Catheter ready for use.

## 2017-05-27 IMAGING — XA IR FLUORO GUIDE CV LINE*R*
1 series · 1 of 1 positions shown · non-contrast
Comparison: none

CLINICAL DATA: Metastatic melanoma, access for chemotherapy

[Series 300: line placements · 1 of 1 slices shown]
[im 1/1]
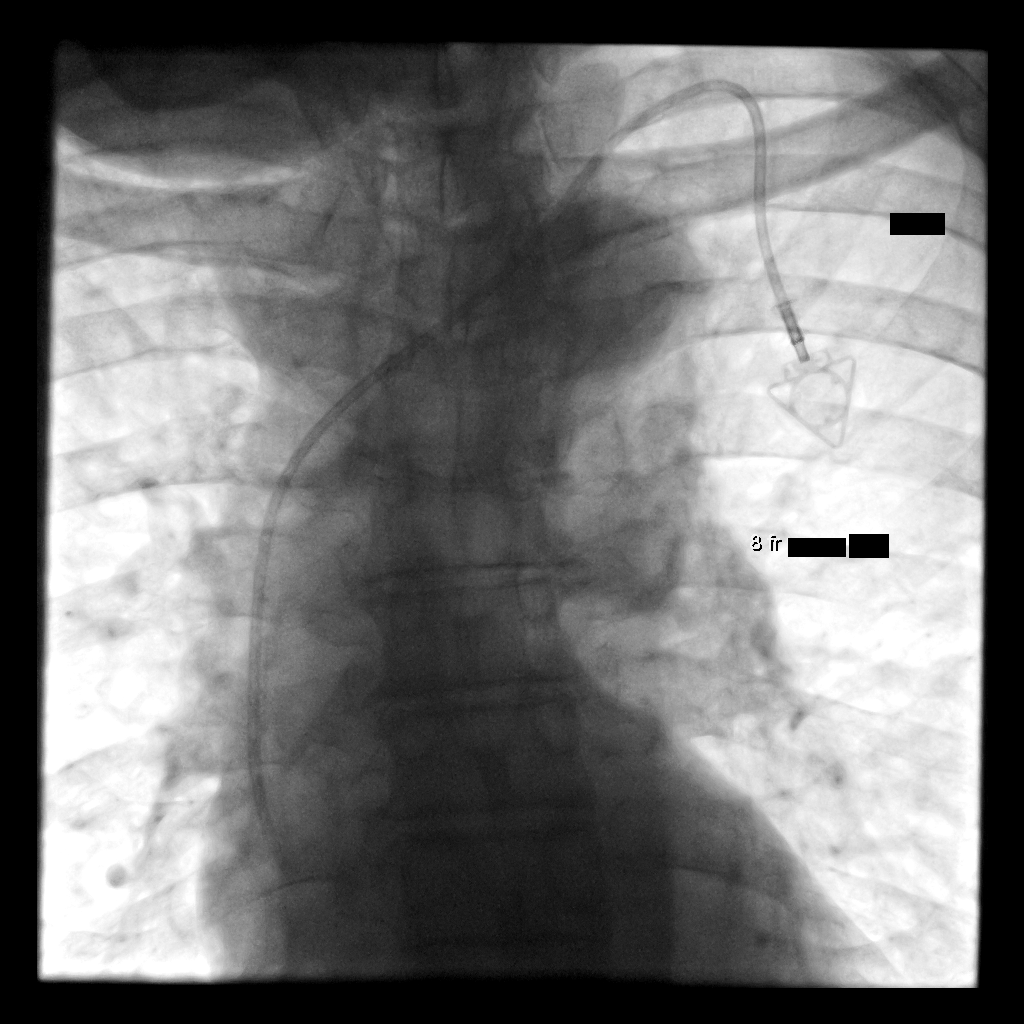

[1 of 1 positions shown; findings below may reference images not displayed]

EXAM:
LEFT INTERNAL JUGULAR SINGLE LUMEN POWER PORT CATHETER INSERTION

Date:  [DATE] [DATE]

Radiologist:  SHY

Guidance:  Ultrasound and fluoroscopic

MEDICATIONS:
Ancef 2 g; The antibiotic was administered within an appropriate
time interval prior to skin puncture.

ANESTHESIA/SEDATION:
Versed 4.0 mg IV; Fentanyl 100 mcg IV;

Moderate Sedation Time:  30 minutes

The patient was continuously monitored during the procedure by the
interventional radiology nurse under my direct supervision.

FLUOROSCOPY TIME:  2.0 minutes, 6 seconds (23 mGy)

COMPLICATIONS:
None immediate.

CONTRAST:  None.

PROCEDURE:
Informed consent was obtained from the patient following explanation
of the procedure, risks, benefits and alternatives. The patient
understands, agrees and consents for the procedure. All questions
were addressed. A time out was performed.

Maximal barrier sterile technique utilized including caps, mask,
sterile gowns, sterile gloves, large sterile drape, hand hygiene,
and 2% chlorhexidine scrub.

Under sterile conditions and local anesthesia, left internal jugular
micropuncture venous access was performed. Access was performed with
ultrasound. Images were obtained for documentation. A guide wire was
inserted followed by a transitional dilator. This allowed insertion
of a guide wire and catheter into the IVC. Measurements were
obtained from the SVC / RA junction back to the left IJ venotomy
site. In the left infraclavicular chest, a subcutaneous pocket was
created over the second anterior rib. This was done under sterile
conditions and local anesthesia. 1% lidocaine with epinephrine was
utilized for this. A 2.5 cm incision was made in the skin. Blunt
dissection was performed to create a subcutaneous pocket over the
right pectoralis major muscle. The pocket was flushed with saline
vigorously. There was adequate hemostasis. The port catheter was
assembled and checked for leakage. The port catheter was secured in
the pocket with two retention sutures. The tubing was tunneled
subcutaneously to the left venotomy site and inserted into the
SVC/RA junction through a valved peel-away sheath. Position was
confirmed with fluoroscopy. Images were obtained for documentation.
The patient tolerated the procedure well. No immediate
complications. Incisions were closed in a two layer fashion with 4 -
0 Vicryl suture. Dermabond was applied to the skin. The port
catheter was accessed, blood was aspirated followed by saline and
heparin flushes. Needle was removed. A dry sterile dressing was
applied.
IMPRESSION: Ultrasound and fluoroscopically guided left internal jugular single
lumen power port catheter insertion. Tip in the SVC/RA junction.
Catheter ready for use.

## 2017-05-27 MED ORDER — CEFAZOLIN SODIUM-DEXTROSE 2-4 GM/100ML-% IV SOLN
INTRAVENOUS | Status: AC
  Filled 2017-05-27: qty 100

## 2017-05-27 MED ORDER — FENTANYL CITRATE (PF) 100 MCG/2ML IJ SOLN
INTRAMUSCULAR | Status: AC
  Filled 2017-05-27: qty 8

## 2017-05-27 MED ORDER — MIDAZOLAM HCL 2 MG/2ML IJ SOLN
INTRAMUSCULAR | Status: AC | PRN
  Administered 2017-05-27 (×4): 1 mg via INTRAVENOUS

## 2017-05-27 MED ORDER — FENTANYL CITRATE (PF) 100 MCG/2ML IJ SOLN
INTRAMUSCULAR | Status: AC | PRN
  Administered 2017-05-27: 50 ug via INTRAVENOUS
  Administered 2017-05-27 (×2): 25 ug via INTRAVENOUS

## 2017-05-27 MED ORDER — LIDOCAINE-EPINEPHRINE (PF) 2 %-1:200000 IJ SOLN
INTRAMUSCULAR | Status: AC
  Filled 2017-05-27: qty 20

## 2017-05-27 MED ORDER — LIDOCAINE-EPINEPHRINE (PF) 2 %-1:200000 IJ SOLN
INTRAMUSCULAR | Status: AC | PRN
  Administered 2017-05-27: 10 mL via INTRADERMAL

## 2017-05-27 MED ORDER — SODIUM CHLORIDE 0.9 % IV SOLN
INTRAVENOUS | Status: DC
  Administered 2017-05-27: 13:00:00 via INTRAVENOUS

## 2017-05-27 MED ORDER — HEPARIN SOD (PORK) LOCK FLUSH 100 UNIT/ML IV SOLN
INTRAVENOUS | Status: AC
  Administered 2017-05-27: 16:00:00
  Filled 2017-05-27: qty 5

## 2017-05-27 MED ORDER — MIDAZOLAM HCL 2 MG/2ML IJ SOLN
INTRAMUSCULAR | Status: AC
  Filled 2017-05-27: qty 8

## 2017-05-27 MED ORDER — CEFAZOLIN SODIUM-DEXTROSE 2-4 GM/100ML-% IV SOLN
2.0000 g | INTRAVENOUS | Status: AC
  Administered 2017-05-27: 2 g via INTRAVENOUS

## 2017-05-27 NOTE — Sedation Documentation (Signed)
Patient is resting comfortably. 

## 2017-05-27 NOTE — Procedures (Signed)
Melanoma, access for chemotherapy  Status post left IJ single lumen power port catheter  Tip SVC/RA junction  Access radio for use  No immediate complication  EBL less than 5 mL  Full report in PACs

## 2017-05-27 NOTE — Discharge Instructions (Signed)
Moderate Conscious Sedation, Adult, Care After These instructions provide you with information about caring for yourself after your procedure. Your health care provider may also give you more specific instructions. Your treatment has been planned according to current medical practices, but problems sometimes occur. Call your health care provider if you have any problems or questions after your procedure. What can I expect after the procedure? After your procedure, it is common:  To feel sleepy for several hours.  To feel clumsy and have poor balance for several hours.  To have poor judgment for several hours.  To vomit if you eat too soon. Follow these instructions at home: For at least 24 hours after the procedure:    Do not:  Participate in activities where you could fall or become injured.  Drive.  Use heavy machinery.  Drink alcohol.  Take sleeping pills or medicines that cause drowsiness.  Make important decisions or sign legal documents.  Take care of children on your own.  Rest. Eating and drinking   Follow the diet recommended by your health care provider.  If you vomit:  Drink water, juice, or soup when you can drink without vomiting.  Make sure you have little or no nausea before eating solid foods. General instructions   Have a responsible adult stay with you until you are awake and alert.  Take over-the-counter and prescription medicines only as told by your health care provider.  If you smoke, do not smoke without supervision.  Keep all follow-up visits as told by your health care provider. This is important. Contact a health care provider if:  You keep feeling nauseous or you keep vomiting.  You feel light-headed.  You develop a rash.  You have a fever. Get help right away if:  You have trouble breathing. This information is not intended to replace advice given to you by your health care provider. Make sure you discuss any questions you  have with your health care provider. Document Released: 10/06/2013 Document Revised: 05/20/2016 Document Reviewed: 04/06/2016 Elsevier Interactive Patient Education  2017 [place] Insertion, Care After This sheet gives you information about how to care for yourself after your procedure. Your health care provider may also give you more specific instructions. If you have problems or questions, contact your health care provider. What can I expect after the procedure? After your procedure, it is common to have:  Discomfort at the port insertion site.  Bruising on the skin over the port. This should improve over 3-4 days. Follow these instructions at home: [facility] care   After your port is placed, you will get a manufacturer's information card. The card has information about your port. Keep this card with you at all times.  Take care of the port as told by your health care provider. Ask your health care provider if you or a family member can get training for taking care of the port at home. A home health care nurse may also take care of the port.  Make sure to remember what type of port you have. Incision care   Follow instructions from your health care provider about how to take care of your port insertion site. Make sure you:  Wash your hands with soap and water before you change your bandage (dressing). If soap and water are not available, use hand sanitizer.  Change your dressing as told by your health care provider.  Leave stitches (sutures), skin glue, or adhesive strips in place. These skin  closures may need to stay in place for 2 weeks or longer. If adhesive strip edges start to loosen and curl up, you may trim the loose edges. Do not remove adhesive strips completely unless your health care provider tells you to do that.  Check your port insertion site every day for signs of infection. Check for:  More redness, swelling, or pain.  More fluid or  blood.  Warmth.  Pus or a bad smell. General instructions   Do not take baths, swim, or use a hot tub until your health care provider approves.  Do not lift anything that is heavier than 10 lb (4.5 kg) for a week, or as told by your health care provider.  Ask your health care provider when it is okay to:  Return to work or school.  Resume usual physical activities or sports.  Do not drive for 24 hours if you were given a medicine to help you relax (sedative).  Take over-the-counter and prescription medicines only as told by your health care provider.  Wear a medical alert bracelet in case of an emergency. This will tell any health care providers that you have a port.  Keep all follow-up visits as told by your health care provider. This is important. Contact a health care provider if:  You cannot flush your port with saline as directed, or you cannot draw blood from the port.  You have a fever or chills.  You have more redness, swelling, or pain around your port insertion site.  You have more fluid or blood coming from your port insertion site.  Your port insertion site feels warm to the touch.  You have pus or a bad smell coming from the port insertion site. Get help right away if:  You have chest pain or shortness of breath.  You have bleeding from your port that you cannot control. Summary  Take care of the port as told by your health care provider.  Change your dressing as told by your health care provider.  Keep all follow-up visits as told by your health care provider. This information is not intended to replace advice given to you by your health care provider. Make sure you discuss any questions you have with your health care provider. Document Released: 10/06/2013 Document Revised: 11/06/2016 Document Reviewed: 11/06/2016 Elsevier Interactive Patient Education  2017 [employer].

## 2017-05-27 NOTE — Consult Note (Signed)
Chief Complaint: Patient was seen in consultation today for Port-A-Cath placement  Referring Physician(s): Wyatt Portela  Supervising Physician: Daryll Brod  Patient Status: [place]  History of Present Illness: William Wilkerson is a 66 y.o. male with remote history of prostate cancer and now with metastatic melanoma, poor venous access who presents today for Port-A-Cath placement for planned treatment.  Past Medical History:  Diagnosis Date  . Cancer [facility]) 2008   prostate  . Hypertension   . Melanoma ([place]) 2013   x 4    Past Surgical History:  Procedure Laterality Date  . MELANOMA EXCISION Right    x4-abd & axillary  . PROSTATE SURGERY  2008   cancer    Allergies: Codeine; Hydromorphone; Morphine; and Percocet [oxycodone-acetaminophen]  Medications: Prior to Admission medications   Medication Sig Start Date End Date Taking? Authorizing Provider  hydrochlorothiazide (MICROZIDE) 12.5 MG capsule Take 12.5 mg by mouth daily.   Yes [provider]  ibuprofen (ADVIL,MOTRIN) 800 MG tablet Take 800 mg by mouth every 8 (eight) hours as needed for mild pain.   Yes [provider]  metoprolol succinate (TOPROL-XL) 50 MG 24 hr tablet Take 50 mg by mouth daily. Take with or immediately following a meal.   Yes [provider]  zolpidem (AMBIEN CR) 6.25 MG CR tablet Take 6.25 mg by mouth at bedtime as needed for sleep.   Yes [provider]  indomethacin (INDOCIN) 50 MG capsule Take 50 mg by mouth 3 (three) times daily as needed.    [provider]  prochlorperazine (COMPAZINE) 10 MG tablet Take 1 tablet (10 mg total) by mouth every 6 (six) hours as needed for nausea or vomiting. 04/25/17   Wyatt Portela, MD     History reviewed. No pertinent family history.  Social History   Social History  . Marital status: Married    Spouse name: N/A  . Number of children: N/A  . Years of education: N/A   Social History Main Topics  .  Smoking status: Never Smoker  . Smokeless tobacco: Never Used  . Alcohol use No  . Drug use: No  . Sexual activity: Not Asked   Other Topics Concern  . None   Social History Narrative  . None      Review of Systems currently denies fever, headache, chest pain, dyspnea, cough, abdominal/back pain, nausea, vomiting or abnormal bleeding. He does have some right arm edema secondary to prior lymph node resection  Vital Signs: BP (!) 161/105   Pulse 66   Temp 98 F (36.7 C) (Oral)   Resp 16   SpO2 100%   Physical Exam awake, alert. Chest clear to auscultation bilaterally. Heart with regular rate and rhythm. Abdomen soft, positive bowel sounds, nontender. No lower extremity edema; right upper extremity edema noted  Mallampati Score:     Imaging: No results found.  Labs:  CBC:  Recent Labs  05/02/17 0912 05/16/17 1249  WBC 3.9* 4.6  HGB 13.1 12.9*  HCT 36.4* 36.5*  PLT 282 346    COAGS: No results for input(s): INR, APTT in the last 8760 hours.  BMP:  Recent Labs  05/02/17 0912 05/16/17 1249  NA 143 139  K 3.4* 3.4*  CO2 25 25  GLUCOSE 89 82  BUN 13.7 7.8  CALCIUM 9.1 9.1  CREATININE 1.2 1.3    LIVER FUNCTION TESTS:  Recent Labs  05/02/17 0912 05/16/17 1249  BILITOT 1.23* 1.23*  AST 29 24  ALT 29 20  ALKPHOS 66 67  PROT 7.4 7.8  ALBUMIN 4.2 4.3    TUMOR MARKERS: No results for input(s): AFPTM, CEA, CA199, CHROMGRNA in the last 8760 hours.  Assessment and Plan: 66 y.o. male with remote history of prostate cancer and now with metastatic melanoma, poor venous access who presents today for Port-A-Cath placement for planned treatment.Risks and benefits discussed with the patient /spouse including, but not limited to bleeding, infection, pneumothorax, or fibrin sheath development and need for additional procedures. All of the patient's questions were answered, patient is agreeable to proceed. Consent signed and in chart.     Thank you for  this interesting consult.  I greatly enjoyed meeting William Wilkerson and look forward to participating in their care.  A copy of this report was sent to the requesting provider on this date.  Electronically Signed: D. Rowe Robert, PA-C 05/27/2017, 1:19 PM   I spent a total of 20 minutes   in face to face in clinical consultation, greater than 50% of which was counseling/coordinating care for Port-A-Cath placement

## 2017-05-28 ENCOUNTER — Other Ambulatory Visit: Payer: BLUE CROSS/BLUE SHIELD

## 2017-05-28 ENCOUNTER — Encounter: Payer: Medicare Other | Admitting: Genetic Counselor

## 2017-05-30 ENCOUNTER — Ambulatory Visit (HOSPITAL_BASED_OUTPATIENT_CLINIC_OR_DEPARTMENT_OTHER): Payer: BLUE CROSS/BLUE SHIELD

## 2017-05-30 ENCOUNTER — Other Ambulatory Visit (HOSPITAL_BASED_OUTPATIENT_CLINIC_OR_DEPARTMENT_OTHER): Payer: BLUE CROSS/BLUE SHIELD

## 2017-05-30 VITALS — BP 133/77 | HR 70 | Temp 98.5°F | Resp 16

## 2017-05-30 DIAGNOSIS — C439 Malignant melanoma of skin, unspecified: Secondary | ICD-10-CM | POA: Diagnosis not present

## 2017-05-30 DIAGNOSIS — Z7189 Other specified counseling: Secondary | ICD-10-CM

## 2017-05-30 DIAGNOSIS — Z5112 Encounter for antineoplastic immunotherapy: Secondary | ICD-10-CM

## 2017-05-30 LAB — COMPREHENSIVE METABOLIC PANEL
ALK PHOS: 68 U/L (ref 40–150)
ALT: 20 U/L (ref 0–55)
ANION GAP: 11 meq/L (ref 3–11)
AST: 26 U/L (ref 5–34)
Albumin: 4.3 g/dL (ref 3.5–5.0)
BUN: 11.3 mg/dL (ref 7.0–26.0)
CALCIUM: 9.3 mg/dL (ref 8.4–10.4)
CO2: 25 mEq/L (ref 22–29)
Chloride: 102 mEq/L (ref 98–109)
Creatinine: 1.3 mg/dL (ref 0.7–1.3)
EGFR: 63 mL/min/{1.73_m2} — AB (ref >90)
Glucose: 89 mg/dl (ref 70–140)
POTASSIUM: 3.5 meq/L (ref 3.5–5.1)
Sodium: 139 mEq/L (ref 136–145)
Total Bilirubin: 1.02 mg/dL (ref 0.20–1.20)
Total Protein: 7.7 g/dL (ref 6.4–8.3)

## 2017-05-30 LAB — CBC WITH DIFFERENTIAL/PLATELET
BASO%: 1.3 % (ref 0.0–2.0)
Basophils Absolute: 0.1 10*3/uL (ref 0.0–0.1)
EOS ABS: 0.3 10*3/uL (ref 0.0–0.5)
EOS%: 5.6 % (ref 0.0–7.0)
HEMATOCRIT: 36.7 % — AB (ref 38.4–49.9)
HEMOGLOBIN: 13 g/dL (ref 13.0–17.1)
LYMPH#: 1.9 10*3/uL (ref 0.9–3.3)
LYMPH%: 37.9 % (ref 14.0–49.0)
MCH: 31.1 pg (ref 27.2–33.4)
MCHC: 35.4 g/dL (ref 32.0–36.0)
MCV: 87.9 fL (ref 79.3–98.0)
MONO#: 0.7 10*3/uL (ref 0.1–0.9)
MONO%: 14 % (ref 0.0–14.0)
NEUT#: 2 10*3/uL (ref 1.5–6.5)
NEUT%: 41.2 % (ref 39.0–75.0)
PLATELETS: 309 10*3/uL (ref 140–400)
RBC: 4.18 10*6/uL — ABNORMAL LOW (ref 4.20–5.82)
RDW: 13.3 % (ref 11.0–14.6)
WBC: 4.9 10*3/uL (ref 4.0–10.3)

## 2017-05-30 MED ORDER — HEPARIN SOD (PORK) LOCK FLUSH 100 UNIT/ML IV SOLN
500.0000 [IU] | Freq: Once | INTRAVENOUS | Status: AC | PRN
  Administered 2017-05-30: 500 [IU]
  Filled 2017-05-30: qty 5

## 2017-05-30 MED ORDER — SODIUM CHLORIDE 0.9 % IV SOLN
Freq: Once | INTRAVENOUS | Status: AC
  Administered 2017-05-30: 15:00:00 via INTRAVENOUS

## 2017-05-30 MED ORDER — SODIUM CHLORIDE 0.9 % IV SOLN
240.0000 mg | Freq: Once | INTRAVENOUS | Status: AC
  Administered 2017-05-30: 240 mg via INTRAVENOUS
  Filled 2017-05-30: qty 24

## 2017-05-30 MED ORDER — LIDOCAINE-PRILOCAINE 2.5-2.5 % EX CREA: TOPICAL_CREAM | CUTANEOUS | 0 refills | Status: DC

## 2017-05-30 MED ORDER — SODIUM CHLORIDE 0.9% FLUSH
10.0000 mL | INTRAVENOUS | Status: DC | PRN
  Administered 2017-05-30: 10 mL
  Filled 2017-05-30: qty 10

## 2017-05-30 NOTE — Patient Instructions (Signed)
[facility] Discharge Instructions for Patients Receiving Chemotherapy  Today you received the following chemotherapy agents Opdivo.  To help prevent nausea and vomiting after your treatment, we encourage you to take your nausea medication as directed.   If you develop nausea and vomiting that is not controlled by your nausea medication, call the clinic.   BELOW ARE SYMPTOMS THAT SHOULD BE REPORTED IMMEDIATELY:  *FEVER GREATER THAN 100.5 F  *CHILLS WITH OR WITHOUT FEVER  NAUSEA AND VOMITING THAT IS NOT CONTROLLED WITH YOUR NAUSEA MEDICATION  *UNUSUAL SHORTNESS OF BREATH  *UNUSUAL BRUISING OR BLEEDING  TENDERNESS IN MOUTH AND THROAT WITH OR WITHOUT PRESENCE OF ULCERS  *URINARY PROBLEMS  *BOWEL PROBLEMS  UNUSUAL RASH Items with * indicate a potential emergency and should be followed up as soon as possible.  Feel free to call the clinic you have any questions or concerns. The clinic phone number is [phone].  Please show the CHEMO ALERT CARD at check-in to the Emergency Department and triage nurse.    

## 2017-06-04 ENCOUNTER — Encounter: Payer: Self-pay | Admitting: *Deleted

## 2017-06-04 NOTE — Progress Notes (Signed)
Spoke with patient, per dr Alen Blew he will see patient at 2:30 on 06/13/17, prior to his chemotherapy  Treatment. LOS to schedulers.

## 2017-06-12 ENCOUNTER — Ambulatory Visit (HOSPITAL_BASED_OUTPATIENT_CLINIC_OR_DEPARTMENT_OTHER): Payer: BLUE CROSS/BLUE SHIELD | Admitting: Genetics

## 2017-06-12 ENCOUNTER — Ambulatory Visit (HOSPITAL_BASED_OUTPATIENT_CLINIC_OR_DEPARTMENT_OTHER): Payer: Medicare Other

## 2017-06-12 ENCOUNTER — Other Ambulatory Visit (HOSPITAL_BASED_OUTPATIENT_CLINIC_OR_DEPARTMENT_OTHER): Payer: BLUE CROSS/BLUE SHIELD

## 2017-06-12 DIAGNOSIS — C439 Malignant melanoma of skin, unspecified: Secondary | ICD-10-CM | POA: Diagnosis present

## 2017-06-12 DIAGNOSIS — Z803 Family history of malignant neoplasm of breast: Secondary | ICD-10-CM

## 2017-06-12 DIAGNOSIS — Z8041 Family history of malignant neoplasm of ovary: Secondary | ICD-10-CM

## 2017-06-12 DIAGNOSIS — Z7189 Other specified counseling: Secondary | ICD-10-CM

## 2017-06-12 DIAGNOSIS — Z95828 Presence of other vascular implants and grafts: Secondary | ICD-10-CM

## 2017-06-12 DIAGNOSIS — Z800 Family history of malignant neoplasm of digestive organs: Secondary | ICD-10-CM | POA: Diagnosis not present

## 2017-06-12 DIAGNOSIS — Z8042 Family history of malignant neoplasm of prostate: Secondary | ICD-10-CM

## 2017-06-12 DIAGNOSIS — Z8546 Personal history of malignant neoplasm of prostate: Secondary | ICD-10-CM

## 2017-06-12 LAB — COMPREHENSIVE METABOLIC PANEL
ALBUMIN: 4.3 g/dL (ref 3.5–5.0)
ALK PHOS: 64 U/L (ref 40–150)
ALT: 22 U/L (ref 0–55)
ANION GAP: 10 meq/L (ref 3–11)
AST: 28 U/L (ref 5–34)
BUN: 8.8 mg/dL (ref 7.0–26.0)
CALCIUM: 9.3 mg/dL (ref 8.4–10.4)
CHLORIDE: 103 meq/L (ref 98–109)
CO2: 27 mEq/L (ref 22–29)
Creatinine: 1.4 mg/dL — ABNORMAL HIGH (ref 0.7–1.3)
EGFR: 61 mL/min/{1.73_m2} — ABNORMAL LOW (ref >90)
Glucose: 91 mg/dl (ref 70–140)
Potassium: 3.7 mEq/L (ref 3.5–5.1)
Sodium: 140 mEq/L (ref 136–145)
TOTAL PROTEIN: 7.9 g/dL (ref 6.4–8.3)
Total Bilirubin: 0.94 mg/dL (ref 0.20–1.20)

## 2017-06-12 LAB — CBC WITH DIFFERENTIAL/PLATELET
BASO%: 1.4 % (ref 0.0–2.0)
Basophils Absolute: 0.1 10*3/uL (ref 0.0–0.1)
EOS%: 4.2 % (ref 0.0–7.0)
Eosinophils Absolute: 0.2 10*3/uL (ref 0.0–0.5)
HEMATOCRIT: 36 % — AB (ref 38.4–49.9)
HEMOGLOBIN: 13 g/dL (ref 13.0–17.1)
LYMPH#: 2.3 10*3/uL (ref 0.9–3.3)
LYMPH%: 47.7 % (ref 14.0–49.0)
MCH: 31.6 pg (ref 27.2–33.4)
MCHC: 36 g/dL (ref 32.0–36.0)
MCV: 87.6 fL (ref 79.3–98.0)
MONO#: 0.5 10*3/uL (ref 0.1–0.9)
MONO%: 11.2 % (ref 0.0–14.0)
NEUT#: 1.7 10*3/uL (ref 1.5–6.5)
NEUT%: 35.5 % — ABNORMAL LOW (ref 39.0–75.0)
PLATELETS: 355 10*3/uL (ref 140–400)
RBC: 4.11 10*6/uL — ABNORMAL LOW (ref 4.20–5.82)
RDW: 13.5 % (ref 11.0–14.6)
WBC: 4.9 10*3/uL (ref 4.0–10.3)

## 2017-06-12 MED ORDER — HEPARIN SOD (PORK) LOCK FLUSH 100 UNIT/ML IV SOLN
500.0000 [IU] | Freq: Once | INTRAVENOUS | Status: AC
  Administered 2017-06-12: 500 [IU]
  Filled 2017-06-12: qty 5

## 2017-06-12 MED ORDER — SODIUM CHLORIDE 0.9% FLUSH
10.0000 mL | Freq: Once | INTRAVENOUS | Status: AC
  Administered 2017-06-12: 10 mL
  Filled 2017-06-12: qty 10

## 2017-06-13 ENCOUNTER — Telehealth: Payer: Self-pay | Admitting: Oncology

## 2017-06-13 ENCOUNTER — Encounter: Payer: Self-pay | Admitting: Genetics

## 2017-06-13 ENCOUNTER — Ambulatory Visit (HOSPITAL_BASED_OUTPATIENT_CLINIC_OR_DEPARTMENT_OTHER): Payer: BLUE CROSS/BLUE SHIELD | Admitting: Oncology

## 2017-06-13 ENCOUNTER — Other Ambulatory Visit: Payer: BLUE CROSS/BLUE SHIELD

## 2017-06-13 ENCOUNTER — Ambulatory Visit (HOSPITAL_BASED_OUTPATIENT_CLINIC_OR_DEPARTMENT_OTHER): Payer: BLUE CROSS/BLUE SHIELD

## 2017-06-13 VITALS — BP 154/95 | HR 70 | Temp 98.6°F | Resp 18 | Ht 68.0 in | Wt 214.7 lb

## 2017-06-13 DIAGNOSIS — C439 Malignant melanoma of skin, unspecified: Secondary | ICD-10-CM

## 2017-06-13 DIAGNOSIS — Z5112 Encounter for antineoplastic immunotherapy: Secondary | ICD-10-CM | POA: Diagnosis not present

## 2017-06-13 DIAGNOSIS — C773 Secondary and unspecified malignant neoplasm of axilla and upper limb lymph nodes: Secondary | ICD-10-CM

## 2017-06-13 MED ORDER — SODIUM CHLORIDE 0.9% FLUSH
10.0000 mL | INTRAVENOUS | Status: DC | PRN
  Administered 2017-06-13: 10 mL
  Filled 2017-06-13: qty 10

## 2017-06-13 MED ORDER — SODIUM CHLORIDE 0.9 % IV SOLN
Freq: Once | INTRAVENOUS | Status: AC
  Administered 2017-06-13: 15:00:00 via INTRAVENOUS

## 2017-06-13 MED ORDER — SODIUM CHLORIDE 0.9 % IV SOLN
240.0000 mg | Freq: Once | INTRAVENOUS | Status: AC
  Administered 2017-06-13: 240 mg via INTRAVENOUS
  Filled 2017-06-13: qty 24

## 2017-06-13 MED ORDER — HEPARIN SOD (PORK) LOCK FLUSH 100 UNIT/ML IV SOLN
500.0000 [IU] | Freq: Once | INTRAVENOUS | Status: AC | PRN
  Administered 2017-06-13: 500 [IU]
  Filled 2017-06-13: qty 5

## 2017-06-13 NOTE — Progress Notes (Signed)
REFERRING PROVIDER: Wyatt Portela, MD [address], [place] [id number]  PRIMARY PROVIDER:  [name], MD  PRIMARY REASON FOR VISIT:  1. Melanoma of skin ([place])   2. History of prostate cancer   3. Family history of colon cancer   4. Family history of ovarian cancer   5. Family history of breast cancer   6. Family history of prostate cancer      HISTORY OF PRESENT ILLNESS:   William Wilkerson, a 66 y.o. male, was seen for a [facility] cancer genetics consultation at the request of Dr. Alen Blew due to a personal and family history of cancer.  William Wilkerson presents to clinic today to discuss the possibility of a hereditary predisposition to cancer, genetic testing, and to further clarify his future cancer risks, as well as potential cancer risks for family members. He was accompanied to his appointment by his youngest son.  CANCER HISTORY: In 2008, at the age of 34, William Wilkerson was diagnosed with prostate cancer. This was treated with prostatectomy. In 2013, William Wilkerson was diagnosed with melanoma of his right lateral abdomen. In 2016 and 2018, William Wilkerson was found to have metastatic melanoma to his lymph nodes.   Past Medical History:  Diagnosis Date  . Cancer [facility]) 2008   prostate  . Hypertension   . Melanoma ([place]) 2013   x 4    Past Surgical History:  Procedure Laterality Date  . IR FLUORO GUIDE PORT INSERTION RIGHT  05/27/2017  . IR US GUIDE VASC ACCESS RIGHT  05/27/2017  . MELANOMA EXCISION Right    x4-abd & axillary  . PROSTATE SURGERY  2008   cancer    Social History   Social History  . Marital status: Married    Spouse name: N/A  . Number of children: N/A  . Years of education: N/A   Social History Main Topics  . Smoking status: Never Smoker  . Smokeless tobacco: Never Used  . Alcohol use No  . Drug use: No  . Sexual activity: Not on file   Other Topics Concern  . Not on file   Social History Narrative  . No narrative on file     FAMILY HISTORY:   We obtained a detailed, 4-generation family history.  Significant diagnoses are listed below: Family History  Problem Relation Age of Onset  . Ovarian cancer Mother 78       d.45  . Lung cancer Sister        d.58s  . Colon cancer Brother        d.54  . Breast cancer Sister   . Colon cancer Brother        d.50s  . Lung cancer Brother   . Prostate cancer Brother   . Ovarian cancer Other        d.62   William Wilkerson has two sons, ages 68 and 31, and a daughter, age 32. None of his children have cancers. William Wilkerson had seven brothers and eight sisters. Two of his brothers died at birth. Of the brothers who lived to adulthood, two had colon cancer and died in their 23s; one is living with lung cancer; another is living with prostate cancer; the fifth is living with no cancer. Of his eight sisters, one died of lung cancer in her late-50s; another had breast cancer and is currently 40; three are living without cancers; three have died without cancers. The daughter of his sister, Hulda Marin (age 23, without cancer),  died at 36 with ovarian cancer.  William Wilkerson mother was diagnosed with ovarian cancer at 46 and died at 34. William Wilkerson father died at 24 without cancers. William Wilkerson had five maternal aunts and one maternal uncle without cancers. He had six paternal uncles and one paternal aunt without cancers. He reports that his mother's five sisters married five of his father's brothers. There are no known cancers in any cousins. William Wilkerson does not know of any cancers in any of his grandparents.  William Wilkerson is unaware of previous family history of genetic testing for hereditary cancer risks. Patient's maternal ancestors are of Native [place] and [place] American descent, and paternal ancestors are of Irish/Caucasian and African American descent. There is no reported Ashkenazi Jewish ancestry. There is no known consanguinity.  GENETIC COUNSELING ASSESSMENT: William Wilkerson is a 66 y.o. male with a personal and  family history which is somewhat suggestive of a hereditary cancer syndrome and predisposition to cancer. We, therefore, discussed and recommended the following at today's visit.   DISCUSSION: We reviewed the characteristics, features and inheritance patterns of hereditary cancer syndromes. We also discussed genetic testing, including the appropriate family members to test, the process of testing, insurance coverage and turn-around-time for results. We discussed the implications of a negative, positive and/or variant of uncertain significant result. We recommended William Wilkerson pursue genetic testing for the Multi-Cancer Panel offered by [facility]. [facility]'s Multi-Cancer Panel includes analysis of the following 83 genes: ALK, APC, ATM, AXIN2, BAP1, BARD1, BLM, BMPR1A, BRCA1, BRCA2, BRIP1, CASR, CDC73, CDH1, CDK4, CDKN1B, CDKN1C, CDKN2A, CEBPA, CHEK2, CTNNA1, DICER1, DIS3L2, EGFR, EPCAM, FH, FLCN, GATA2, GPC3, GREM1, HOXB13, HRAS, KIT, MAX, MEN1, MET, MITF, MLH1, MSH2, MSH3, MSH6, MUTYH, NBN, NF1, NF2, NTHL1, PALB2, PDGFRA, PHOX2B, PMS2, POLD1, POLE, POT1, PRKAR1A, PTCH1, PTEN, RAD50, RAD51C, RAD51D, RB1, RECQL4, RET, RUNX1, SDHA, SDHAF2, SDHB, SDHC, SDHD, SMAD4, SMARCA4, SMARCB1, SMARCE1, STK11, SUFU, TERC, TERT, TMEM127, TP53, TSC1, TSC2, VHL, WRN, WT1.   Based on William Wilkerson's personal and family history of cancer, he meets medical criteria for genetic testing. Despite that he meets criteria, he may still have an out of pocket cost. We discussed that if his out of pocket cost for testing is over $100, the laboratory will call and confirm whether he wants to proceed with testing.  If the out of pocket cost of testing is less than $100 he will be billed by the genetic testing laboratory.   PLAN: After considering the risks, benefits, and limitations, William Wilkerson  provided informed consent to pursue genetic testing and the blood sample was sent to [facility] for analysis of the 83-gene Multi-Cancers Panel.  Results should be available within approximately 3 weeks' time, at which point they will be disclosed by telephone to William Wilkerson, as will any additional recommendations warranted by these results. This information will also be available in Epic.   Lastly, we encouraged Mr. Concepcion to remain in contact with cancer genetics annually so that we can continuously update the family history and inform him of any changes in cancer genetics and testing that may be of benefit for this family.   Mr.  Wilkerson questions were answered to his satisfaction today. Our contact information was provided should additional questions or concerns arise. Thank you for the referral and allowing [place] to share in the care of your patient.   Mal Misty, MS, [facility] Certified Naval [email or web address] phone: [phone]  The patient was seen for a total of 30 minutes in face-to-face genetic counseling.  This patient was discussed with Drs. Magrinat, Lindi Adie and/or Burr Medico who agrees with the above.    _______________________________________________________________________ For Office Staff:  Number of people involved in session: 2 Was an Intern/ student involved with case: no

## 2017-06-13 NOTE — Progress Notes (Signed)
Hematology and Oncology Follow Up Visit  Karen Kinnard [id number] 1951-06-01 66 y.o. 06/13/2017 2:49 PM [name], [name], MDAsres, [name], MD   Principle Diagnosis: 66 year old gentleman with metastatic melanoma initially diagnosed in 2014 with a flank lesion. His initial pathology showed desmoplastic subtype. He developed a stage IIIc subsequently and currently has metastatic disease with recurrence in the right axilla.   Prior Therapy:  He was found to have a tumor on his right lateral abdominal area in 2014. 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 He was observed for an extended period of time while he is getting his care at [facility].   He subsequently developed stage III disease with right axillary lymph node involvement in 2016. He underwent right axillary lymph node dissection in January 2017 with 1 out of 24 lymph node positive for metastatic melanoma. This was performed by Dr. Freddi Che. He remained on active surveillance since that time with follow-up with Dr. Clance Boll.   Repeat imaging studies did show pulmonary nodule that remained stable around extended period of time. His most recent imaging studies in 2018 did not show any evidence of metastatic disease.  He developed a palpable nodule in the axilla and in March 2018. This was surgically resected with residual tumor at that time.  Current therapy:  Nivolumab 240 mg every 2 weeks started on 05/02/2017. He is here for the next treatment.  Interim History: Mr. Wulff presents today for a follow-up visit. Since last visit, he reports possibly noticing a lump under his axilla. He also had a Port-A-Cath inserted without complications. He is receiving Nivolumab infusion via Port-A-Cath without complications. He denied any infusion-related complications, arthralgias or myalgias. He denied any pruritus or skin rash. He does report mild fatigue but not affecting his ability to work and attends to activities of daily  living. He denied any skin rashes or lesions.  He does not report any headaches, blurry vision, syncope or seizures. He does not report any fevers, chills, sweats or weight loss. He does not report any chest pain, palpitation, orthopnea or leg edema. He does not report any nausea, vomiting or abdominal pain. He does not report any constipation or diarrhea. He does not report any frequency urgency or hesitancy. Remaining review of systems unremarkable  Medications: I have reviewed the patient's current medications.  Current Outpatient Prescriptions  Medication Sig Dispense Refill  . hydrochlorothiazide (MICROZIDE) 12.5 MG capsule Take 12.5 mg by mouth daily.    Marland Kitchen ibuprofen (ADVIL,MOTRIN) 800 MG tablet Take 800 mg by mouth every 8 (eight) hours as needed for mild pain.    . indomethacin (INDOCIN) 50 MG capsule Take 50 mg by mouth 3 (three) times daily as needed.    . lidocaine-prilocaine (EMLA) cream Apply generous amount to portacath 1 hr prior to infusion.  DO  NOT RUB IN. 30 g 0  . metoprolol succinate (TOPROL-XL) 50 MG 24 hr tablet Take 50 mg by mouth daily. Take with or immediately following a meal.    . prochlorperazine (COMPAZINE) 10 MG tablet Take 1 tablet (10 mg total) by mouth every 6 (six) hours as needed for nausea or vomiting. 30 tablet 0  . zolpidem (AMBIEN CR) 6.25 MG CR tablet Take 6.25 mg by mouth at bedtime as needed for sleep.     No current facility-administered medications for this visit.      Allergies:  Allergies  Allergen Reactions  . Codeine Other (See Comments)    "Makes me crazy"  .  Hydromorphone Anxiety  . Morphine Other (See Comments)    "makes me crazy"  . Percocet [Oxycodone-Acetaminophen] Other (See Comments)    Narcotic medications make him "feel weird" and anxious    Past Medical History, Surgical history, Social history, and Family History were reviewed and updated.  Physical Exam: Blood pressure (!) 154/95, pulse 70, temperature 98.6 F (37 C),  temperature source Oral, resp. rate 18, height '5\' 8"'  (1.727 m), weight 214 lb 11.2 oz (97.4 kg), SpO2 97 %. ECOG: 0 General appearance: Well-appearing John without distress. Head: Normocephalic, without obvious abnormality no oral ulcers or lesions Neck: no adenopathy Lymph nodes: Cervical, supraclavicular, and axillary nodes normal. Very small pea size mass noted in his right axilla. This is unchanged from previous examination and could potentially have gone smaller before the treatment his treatments. Heart:regular rate and rhythm, S1, S2 normal, no murmur, click, rub or gallop Lung:chest clear, no wheezing, rales, normal symmetric air entry. Abdomin: soft, non-tender, without masses or organomegaly. No shifting dullness or ascites. EXT:no erythema, induration, or nodules   Lab Results: Lab Results  Component Value Date   WBC 4.9 06/12/2017   HGB 13.0 06/12/2017   HCT 36.0 (L) 06/12/2017   MCV 87.6 06/12/2017   PLT 355 06/12/2017     Chemistry      Component Value Date/Time   NA 140 06/12/2017 1503   K 3.7 06/12/2017 1503   CO2 27 06/12/2017 1503   BUN 8.8 06/12/2017 1503   CREATININE 1.4 (H) 06/12/2017 1503      Component Value Date/Time   CALCIUM 9.3 06/12/2017 1503   ALKPHOS 64 06/12/2017 1503   AST 28 06/12/2017 1503   ALT 22 06/12/2017 1503   BILITOT 0.94 06/12/2017 1503      Impression and Plan:  66 year old gentleman with the following issues:  1. Metastatic melanoma diagnosed in March 2018. His initial diagnosis was in 2014 where he presented with a desmoplastic lesion on his right flank. He subsequently developed stage IIIc disease with right axillary lymph node involvement. He is status post axillary lymph node dissection completed in January 2017 with 1 out of 24 lymph nodes involved.  He developed regional metastatic disease with a axillary nodule that was resected on 03/18/2017 with residual tumor noted microscopically. He was offered repeat surgical  therapy and he declined.  He is currently receiving systemic therapy with Nivolumab and have tolerated treatment well so far. His examination today did not reveal any new nodules or lesions. He did report feeling a lump under his axilla and to my examination he does have the original mass which potentially have decreased in size.  The plan is to continue with the same dose and schedule for the time being and will have a repeat imaging studies in July 2018. These will be done a chance each [facility] and anticipate continuing treatment if his disease responds.  2. IV access: Port-A-Cath inserted without complications.  3. Immune mediated complications surveillance: TSH repeated periodically.  4. Genetic counseling: He has been screened for hereditary cancer syndrome. He met with genetic counseling and laboratory testing is currently pending.  5. Follow-up: Will be in 2 weeks the procedure next cycle of therapy.  Zola Button, MD 6/15/20182:49 PM

## 2017-06-13 NOTE — Patient Instructions (Signed)
[facility] Discharge Instructions for Patients Receiving Chemotherapy  Today you received the following chemotherapy agents Opdivo.  To help prevent nausea and vomiting after your treatment, we encourage you to take your nausea medication as directed.   If you develop nausea and vomiting that is not controlled by your nausea medication, call the clinic.   BELOW ARE SYMPTOMS THAT SHOULD BE REPORTED IMMEDIATELY:  *FEVER GREATER THAN 100.5 F  *CHILLS WITH OR WITHOUT FEVER  NAUSEA AND VOMITING THAT IS NOT CONTROLLED WITH YOUR NAUSEA MEDICATION  *UNUSUAL SHORTNESS OF BREATH  *UNUSUAL BRUISING OR BLEEDING  TENDERNESS IN MOUTH AND THROAT WITH OR WITHOUT PRESENCE OF ULCERS  *URINARY PROBLEMS  *BOWEL PROBLEMS  UNUSUAL RASH Items with * indicate a potential emergency and should be followed up as soon as possible.  Feel free to call the clinic you have any questions or concerns. The clinic phone number is [phone].  Please show the CHEMO ALERT CARD at check-in to the Emergency Department and triage nurse.    

## 2017-06-13 NOTE — Telephone Encounter (Signed)
Scheduled appt per 6/15 los. Gave patient AVS and calender.

## 2017-06-27 ENCOUNTER — Ambulatory Visit (HOSPITAL_BASED_OUTPATIENT_CLINIC_OR_DEPARTMENT_OTHER): Payer: BLUE CROSS/BLUE SHIELD | Admitting: Oncology

## 2017-06-27 ENCOUNTER — Ambulatory Visit: Payer: Medicare Other

## 2017-06-27 ENCOUNTER — Other Ambulatory Visit (HOSPITAL_BASED_OUTPATIENT_CLINIC_OR_DEPARTMENT_OTHER): Payer: BLUE CROSS/BLUE SHIELD

## 2017-06-27 ENCOUNTER — Ambulatory Visit (HOSPITAL_BASED_OUTPATIENT_CLINIC_OR_DEPARTMENT_OTHER): Payer: BLUE CROSS/BLUE SHIELD

## 2017-06-27 VITALS — BP 158/87 | HR 64 | Temp 98.3°F | Resp 16 | Ht 68.0 in | Wt 215.2 lb

## 2017-06-27 DIAGNOSIS — C773 Secondary and unspecified malignant neoplasm of axilla and upper limb lymph nodes: Secondary | ICD-10-CM

## 2017-06-27 DIAGNOSIS — C439 Malignant melanoma of skin, unspecified: Secondary | ICD-10-CM

## 2017-06-27 DIAGNOSIS — Z5112 Encounter for antineoplastic immunotherapy: Secondary | ICD-10-CM

## 2017-06-27 DIAGNOSIS — Z7189 Other specified counseling: Secondary | ICD-10-CM

## 2017-06-27 LAB — COMPREHENSIVE METABOLIC PANEL
ALT: 17 U/L (ref 0–55)
ANION GAP: 10 meq/L (ref 3–11)
AST: 21 U/L (ref 5–34)
Albumin: 4.1 g/dL (ref 3.5–5.0)
Alkaline Phosphatase: 61 U/L (ref 40–150)
BILIRUBIN TOTAL: 0.98 mg/dL (ref 0.20–1.20)
BUN: 9.6 mg/dL (ref 7.0–26.0)
CALCIUM: 9.1 mg/dL (ref 8.4–10.4)
CO2: 27 meq/L (ref 22–29)
CREATININE: 1.3 mg/dL (ref 0.7–1.3)
Chloride: 103 mEq/L (ref 98–109)
EGFR: 63 mL/min/{1.73_m2} — AB (ref >90)
Glucose: 88 mg/dl (ref 70–140)
Potassium: 3.5 mEq/L (ref 3.5–5.1)
Sodium: 140 mEq/L (ref 136–145)
TOTAL PROTEIN: 7.3 g/dL (ref 6.4–8.3)

## 2017-06-27 LAB — CBC WITH DIFFERENTIAL/PLATELET
BASO%: 1.8 % (ref 0.0–2.0)
Basophils Absolute: 0.1 10*3/uL (ref 0.0–0.1)
EOS ABS: 0.3 10*3/uL (ref 0.0–0.5)
EOS%: 5.4 % (ref 0.0–7.0)
HEMATOCRIT: 35.7 % — AB (ref 38.4–49.9)
HGB: 12.5 g/dL — ABNORMAL LOW (ref 13.0–17.1)
LYMPH#: 2.1 10*3/uL (ref 0.9–3.3)
LYMPH%: 43.1 % (ref 14.0–49.0)
MCH: 31 pg (ref 27.2–33.4)
MCHC: 35.1 g/dL (ref 32.0–36.0)
MCV: 88.3 fL (ref 79.3–98.0)
MONO#: 0.6 10*3/uL (ref 0.1–0.9)
MONO%: 11.5 % (ref 0.0–14.0)
NEUT#: 1.8 10*3/uL (ref 1.5–6.5)
NEUT%: 38.2 % — AB (ref 39.0–75.0)
PLATELETS: 361 10*3/uL (ref 140–400)
RBC: 4.04 10*6/uL — AB (ref 4.20–5.82)
RDW: 13.2 % (ref 11.0–14.6)
WBC: 4.8 10*3/uL (ref 4.0–10.3)

## 2017-06-27 MED ORDER — SODIUM CHLORIDE 0.9 % IV SOLN
240.0000 mg | Freq: Once | INTRAVENOUS | Status: AC
  Administered 2017-06-27: 240 mg via INTRAVENOUS
  Filled 2017-06-27: qty 24

## 2017-06-27 MED ORDER — SODIUM CHLORIDE 0.9 % IV SOLN
Freq: Once | INTRAVENOUS | Status: AC
  Administered 2017-06-27: 14:00:00 via INTRAVENOUS

## 2017-06-27 MED ORDER — HEPARIN SOD (PORK) LOCK FLUSH 100 UNIT/ML IV SOLN
500.0000 [IU] | Freq: Once | INTRAVENOUS | Status: AC | PRN
  Administered 2017-06-27: 500 [IU]
  Filled 2017-06-27: qty 5

## 2017-06-27 MED ORDER — SODIUM CHLORIDE 0.9% FLUSH
10.0000 mL | INTRAVENOUS | Status: DC | PRN
  Administered 2017-06-27: 10 mL
  Filled 2017-06-27: qty 10

## 2017-06-27 NOTE — Progress Notes (Signed)
Hematology and Oncology Follow Up Visit  William Wilkerson [id number] 1951/06/28 66 y.o. 06/27/2017 1:52 PM William Wilkerson, William Wilkerson, MDAsres, [name], MD   Principle Diagnosis: 66 year old gentleman with metastatic melanoma initially diagnosed in 2014 with a flank lesion. His initial pathology showed desmoplastic subtype. He developed a stage IIIc subsequently and currently has metastatic disease with recurrence in the right axilla.   Prior Therapy:  He was found to have a tumor on his right lateral abdominal area in 2014. 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 He was observed for an extended period of time while he is getting his care at [facility].   He subsequently developed stage III disease with right axillary lymph node involvement in 2016. He underwent right axillary lymph node dissection in January 2017 with 1 out of 24 lymph node positive for metastatic melanoma. This was performed by Dr. Freddi Che. He remained on active surveillance since that time with follow-up with Dr. Clance Boll.   Repeat imaging studies did show pulmonary nodule that remained stable around extended period of time. His most recent imaging studies in 2018 did not show any evidence of metastatic disease.  He developed a palpable nodule in the axilla and in March 2018. This was surgically resected with residual tumor at that time.  Current therapy:  Nivolumab 240 mg every 2 weeks started on 05/02/2017. He is here for the next treatment. He is here for cycle 5 of treatment.  Interim History: William Wilkerson presents today for a follow-up visit. Since last visit, he reports no changes in his health. He is receiving Nivolumab infusion via Port-A-Cath without complications. He denied any infusion-related complications, arthralgias or myalgias. He is reporting some mild pruritus without any rash. He does report mild fatigue but not affecting his ability to work and attends to activities of daily living. He denied any  skin rashes or lesions. He denied any axillary masses that he palpated in the past. Performance status and activity level continues to be excellent.  He does not report any headaches, blurry vision, syncope or seizures. He does not report any fevers, chills, sweats or weight loss. He does not report any chest pain, palpitation, orthopnea or leg edema. He does not report any nausea, vomiting or abdominal pain. He does not report any constipation or diarrhea. He does not report any frequency urgency or hesitancy. Remaining review of systems unremarkable  Medications: I have reviewed the patient's current medications.  Current Outpatient Prescriptions  Medication Sig Dispense Refill  . hydrochlorothiazide (MICROZIDE) 12.5 MG capsule Take 12.5 mg by mouth daily.    Marland Kitchen ibuprofen (ADVIL,MOTRIN) 800 MG tablet Take 800 mg by mouth every 8 (eight) hours as needed for mild pain.    . indomethacin (INDOCIN) 50 MG capsule Take 50 mg by mouth 3 (three) times daily as needed.    . lidocaine-prilocaine (EMLA) cream Apply generous amount to portacath 1 hr prior to infusion.  DO  NOT RUB IN. 30 g 0  . metoprolol succinate (TOPROL-XL) 50 MG 24 hr tablet Take 50 mg by mouth daily. Take with or immediately following a meal.    . prochlorperazine (COMPAZINE) 10 MG tablet Take 1 tablet (10 mg total) by mouth every 6 (six) hours as needed for nausea or vomiting. 30 tablet 0  . zolpidem (AMBIEN CR) 6.25 MG CR tablet Take 6.25 mg by mouth at bedtime as needed for sleep.     No current facility-administered medications for this visit.  Allergies:  Allergies  Allergen Reactions  . Codeine Other (See Comments)    "Makes me crazy"  . Hydromorphone Anxiety  . Morphine Other (See Comments)    "makes me crazy"  . Percocet [Oxycodone-Acetaminophen] Other (See Comments)    Narcotic medications make him "feel weird" and anxious    Past Medical History, Surgical history, Social history, and Family History were  reviewed and updated.  Physical Exam: Blood pressure (!) 158/87, pulse 64, temperature 98.3 F (36.8 C), temperature source Oral, resp. rate 16, height _0  (1.727 m), weight 215 lb 3.2 oz (97.6 kg), SpO2 98 %. ECOG: 0 General appearance: Alert, awake gentleman without distress. Head: Normocephalic, without obvious abnormality no oral ulcers or lesions Neck: no adenopathy Lymph nodes: Cervical, supraclavicular, and axillary nodes normal. I could not appreciate any axillary adenopathy or masses.  Heart:regular rate and rhythm, S1, S2 normal, no murmur, click, rub or gallop Lung:chest clear, no wheezing, rales, normal symmetric air entry. Abdomin: soft, non-tender, without masses or organomegaly. No shifting dullness or ascites. EXT:no erythema, induration, or nodules   Lab Results: Lab Results  Component Value Date   WBC 4.8 06/27/2017   HGB 12.5 (L) 06/27/2017   HCT 35.7 (L) 06/27/2017   MCV 88.3 06/27/2017   PLT 361 06/27/2017     Chemistry      Component Value Date/Time   NA 140 06/12/2017 1503   K 3.7 06/12/2017 1503   CO2 27 06/12/2017 1503   BUN 8.8 06/12/2017 1503   CREATININE 1.4 (H) 06/12/2017 1503      Component Value Date/Time   CALCIUM 9.3 06/12/2017 1503   ALKPHOS 64 06/12/2017 1503   AST 28 06/12/2017 1503   ALT 22 06/12/2017 1503   BILITOT 0.94 06/12/2017 1503      Impression and Plan:  66 year old gentleman with the following issues:  1. Metastatic melanoma diagnosed in March 2018. His initial diagnosis was in 2014 where he presented with a desmoplastic lesion on his right flank. He subsequently developed stage IIIc disease with right axillary lymph node involvement. He is status post axillary lymph node dissection completed in January 2017 with 1 out of 24 lymph nodes involved.  He developed regional metastatic disease with a axillary nodule that was resected on 03/18/2017 with residual tumor noted microscopically. He was offered repeat surgical  therapy and he declined.  He is currently receiving systemic therapy with Nivolumab and have tolerated treatment well so far.   The plan is to continue with the same dose and schedule for the time being and will have a repeat imaging studies and one week.  Depending on the results of the scan will determine the duration of treatment. He'll continue to follow at [facility] for his care and he will receive treatment locally if needed to.  2. IV access: Port-A-Cath inserted without complications.  3. Immune mediated complications surveillance: TSH repeated periodically.  4. Genetic counseling: He has been screened for hereditary cancer syndrome. He met with genetic counseling and laboratory testing is currently pending.  5. Follow-up: Will be in 2 weeks the procedure next cycle of therapy.  Zola Button, MD 6/29/20181:52 PM

## 2017-06-27 NOTE — Patient Instructions (Signed)
[facility] Discharge Instructions for Patients Receiving Chemotherapy  Today you received the following chemotherapy agents Opdivo.  To help prevent nausea and vomiting after your treatment, we encourage you to take your nausea medication as directed.   If you develop nausea and vomiting that is not controlled by your nausea medication, call the clinic.   BELOW ARE SYMPTOMS THAT SHOULD BE REPORTED IMMEDIATELY:  *FEVER GREATER THAN 100.5 F  *CHILLS WITH OR WITHOUT FEVER  NAUSEA AND VOMITING THAT IS NOT CONTROLLED WITH YOUR NAUSEA MEDICATION  *UNUSUAL SHORTNESS OF BREATH  *UNUSUAL BRUISING OR BLEEDING  TENDERNESS IN MOUTH AND THROAT WITH OR WITHOUT PRESENCE OF ULCERS  *URINARY PROBLEMS  *BOWEL PROBLEMS  UNUSUAL RASH Items with * indicate a potential emergency and should be followed up as soon as possible.  Feel free to call the clinic you have any questions or concerns. The clinic phone number is [phone].  Please show the CHEMO ALERT CARD at check-in to the Emergency Department and triage nurse.    

## 2017-06-27 NOTE — Patient Instructions (Signed)
Implanted Port Home Guide An implanted port is a type of central line that is placed under the skin. Central lines are used to provide IV access when treatment or nutrition needs to be given through a person's veins. Implanted ports are used for long-term IV access. An implanted port may be placed because:  You need IV medicine that would be irritating to the small veins in your hands or arms.  You need long-term IV medicines, such as antibiotics.  You need IV nutrition for a long period.  You need frequent blood draws for lab tests.  You need dialysis.  Implanted ports are usually placed in the chest area, but they can also be placed in the upper arm, the abdomen, or the leg. An implanted port has two main parts:  Reservoir. The reservoir is round and will appear as a small, raised area under your skin. The reservoir is the part where a needle is inserted to give medicines or draw blood.  Catheter. The catheter is a thin, flexible tube that extends from the reservoir. The catheter is placed into a large vein. Medicine that is inserted into the reservoir goes into the catheter and then into the vein.  How will I care for my incision site? Do not get the incision site wet. Bathe or shower as directed by your health care provider. How is my port accessed? Special steps must be taken to access the port:  Before the port is accessed, a numbing cream can be placed on the skin. This helps numb the skin over the port site.  Your health care provider uses a sterile technique to access the port. ? Your health care provider must put on a mask and sterile gloves. ? The skin over your port is cleaned carefully with an antiseptic and allowed to dry. ? The port is gently pinched between sterile gloves, and a needle is inserted into the port.  Only "non-coring" port needles should be used to access the port. Once the port is accessed, a blood return should be checked. This helps ensure that the port  is in the vein and is not clogged.  If your port needs to remain accessed for a constant infusion, a clear (transparent) bandage will be placed over the needle site. The bandage and needle will need to be changed every week, or as directed by your health care provider.  Keep the bandage covering the needle clean and dry. Do not get it wet. Follow your health care provider's instructions on how to take a shower or bath while the port is accessed.  If your port does not need to stay accessed, no bandage is needed over the port.  What is flushing? Flushing helps keep the port from getting clogged. Follow your health care provider's instructions on how and when to flush the port. Ports are usually flushed with saline solution or a medicine called heparin. The need for flushing will depend on how the port is used.  If the port is used for intermittent medicines or blood draws, the port will need to be flushed: ? After medicines have been given. ? After blood has been drawn. ? As part of routine maintenance.  If a constant infusion is running, the port may not need to be flushed.  How long will my port stay implanted? The port can stay in for as long as your health care provider thinks it is needed. When it is time for the port to come out, surgery will be   done to remove it. The procedure is similar to the one performed when the port was put in. When should I seek immediate medical care? When you have an implanted port, you should seek immediate medical care if:  You notice a bad smell coming from the incision site.  You have swelling, redness, or drainage at the incision site.  You have more swelling or pain at the port site or the surrounding area.  You have a fever that is not controlled with medicine.  This information is not intended to replace advice given to you by your health care provider. Make sure you discuss any questions you have with your health care provider. Document  Released: 12/16/2005 Document Revised: 05/23/2016 Document Reviewed: 08/23/2013 Elsevier Interactive Patient Education  2017 Elsevier Inc.  

## 2017-07-04 ENCOUNTER — Ambulatory Visit: Payer: Self-pay | Admitting: Genetics

## 2017-07-04 ENCOUNTER — Encounter: Payer: Self-pay | Admitting: Genetics

## 2017-07-04 ENCOUNTER — Telehealth: Payer: Self-pay | Admitting: Genetics

## 2017-07-04 DIAGNOSIS — Z1379 Encounter for other screening for genetic and chromosomal anomalies: Secondary | ICD-10-CM

## 2017-07-04 HISTORY — DX: Encounter for other screening for genetic and chromosomal anomalies: Z13.79

## 2017-07-04 NOTE — Telephone Encounter (Signed)
Reviewed that germline genetic testing revealed no pathogenic mutations. This is considered to be a negative result. Testing was performed through [facility]'s 83-gene Multi-Cancers Panel. [facility]'s Multi-Cancer Panel includes analysis of the following 83 genes: ALK, APC, ATM, AXIN2, BAP1, BARD1, BLM, BMPR1A, BRCA1, BRCA2, BRIP1, CASR, CDC73, CDH1, CDK4, CDKN1B, CDKN1C, CDKN2A, CEBPA, CHEK2, CTNNA1, DICER1, DIS3L2, EGFR, EPCAM, FH, FLCN, GATA2, GPC3, GREM1, HOXB13, HRAS, KIT, MAX, MEN1, MET, MITF, MLH1, MSH2, MSH3, MSH6, MUTYH, NBN, NF1, NF2, NTHL1, PALB2, PDGFRA, PHOX2B, PMS2, POLD1, POLE, POT1, PRKAR1A, PTCH1, PTEN, RAD50, RAD51C, RAD51D, RB1, RECQL4, RET, RUNX1, SDHA, SDHAF2, SDHB, SDHC, SDHD, SMAD4, SMARCA4, SMARCB1, SMARCE1, STK11, SUFU, TERC, TERT, TMEM127, TP53, TSC1, TSC2, VHL, WRN, WT1.  A variant of uncertain significance (VUS) was noted in RECQL4. The specific RECQL4 variant is c.212A>C (p.Glu71Ala). Discussed that this VUS should not change clinical management.  Cancer risks and management based on family history alone was discussed.  For more detailed discussion, please see genetic counseling documentation from 07/04/2017. Result report dated 07/03/2017.

## 2017-07-04 NOTE — Progress Notes (Signed)
HPI: Mr. Matsuo was previously seen in the [place] clinic on 06/12/2017 due to a personal and family history of cancer and concerns regarding a hereditary predisposition to cancer. Please refer to our prior cancer genetics clinic note for more information regarding Mr. Markell's medical, social and family histories, and our assessment and recommendations, at the time. Mr. Scheaffer recent genetic test results were disclosed to him, as were recommendations warranted by these results. These results and recommendations are discussed in more detail below.  CANCER HISTORY: In 2008, at the age of 28, Mr. Migues was diagnosed with prostate cancer. This was treated with prostatectomy. In 2013, Mr. Blank was diagnosed with melanoma of his right lateral abdomen. In 2016 and 2018, Mr. Currie was found to have metastatic melanoma to his lymph nodes.    Melanoma of skin ([place])   04/25/2017 Initial Diagnosis    Melanoma of skin ([place])     07/03/2017 Genetic Testing    Genetic counseling and testing for hereditary cancer syndromes performed on 06/12/2017. Results are negative for pathogenic mutations in 83 genes analyzed by [facility]'s Multi-Cancers Panel. Results are dated 07/03/2017. Genes tested: ALK, APC, ATM, AXIN2, BAP1, BARD1, BLM, BMPR1A, BRCA1, BRCA2, BRIP1, CASR, CDC73, CDH1, CDK4, CDKN1B, CDKN1C, CDKN2A, CEBPA, CHEK2, CTNNA1, DICER1, DIS3L2, EGFR, EPCAM, FH, FLCN, GATA2, GPC3, GREM1, HOXB13, HRAS, KIT, MAX, MEN1, MET, MITF, MLH1, MSH2, MSH3, MSH6, MUTYH, NBN, NF1, NF2, NTHL1, PALB2, PDGFRA, PHOX2B, PMS2, POLD1, POLE, POT1, PRKAR1A, PTCH1, PTEN, RAD50, RAD51C, RAD51D, RB1, RECQL4, RET, RUNX1, SDHA, SDHAF2, SDHB, SDHC, SDHD, SMAD4, SMARCA4, SMARCB1, SMARCE1, STK11, SUFU, TERC, TERT, TMEM127, TP53, TSC1, TSC2, VHL, WRN, WT1.  A variant of uncertain significance (not clinically actionable) was noted in RECQL4.           FAMILY HISTORY:  We obtained a detailed, 4-generation family history.   Significant diagnoses are listed below: Family History  Problem Relation Age of Onset  . Ovarian cancer Mother 18       d.45  . Lung cancer Sister        d.58s  . Colon cancer Brother        d.54  . Breast cancer Sister   . Colon cancer Brother        d.50s  . Lung cancer Brother   . Prostate cancer Brother   . Ovarian cancer Other        d.62   Mr. Broadfoot has two sons, ages 105 and 71, and a daughter, age 56. None of his children have cancers. Mr. Edelen had seven brothers and eight sisters. Two of his brothers died at birth. Of the brothers who lived to adulthood, two had colon cancer and died in their 108s; one is living with lung cancer; another is living with prostate cancer; the fifth is living with no cancer. Of his eight sisters, one died of lung cancer in her late-50s; another had breast cancer and is currently 31; three are living without cancers; three have died without cancers. The daughter of his sister, Hulda Marin (age 38, without cancer), died at 44 with ovarian cancer.  Mr. Sullenberger mother was diagnosed with ovarian cancer at 84 and died at 2. Mr. Stangl father died at 29 without cancers. Mr. Angert had five maternal aunts and one maternal uncle without cancers. He had six paternal uncles and one paternal aunt without cancers. He reports that his mother's five sisters married five of his father's brothers. There are no known cancers in any cousins. Mr. Mollett  does not know of any cancers in any of his grandparents.  Mr. Zajkowski is unaware of previous family history of genetic testing for hereditary cancer risks. Patient's maternal ancestors are of Native [place] and [place] American descent, and paternal ancestors are of Irish/Caucasian and African American descent. There is no reported Ashkenazi Jewish ancestry. There is no known consanguinity.  GENETIC TEST RESULTS: Genetic testing performed through [facility]'s Multi-Cancers Panel reported out on 07/03/2017 showed no pathogenic  mutations. [facility]'s Multi-Cancer Panel includes analysis of the following 83 genes: ALK, APC, ATM, AXIN2, BAP1, BARD1, BLM, BMPR1A, BRCA1, BRCA2, BRIP1, CASR, CDC73, CDH1, CDK4, CDKN1B, CDKN1C, CDKN2A, CEBPA, CHEK2, CTNNA1, DICER1, DIS3L2, EGFR, EPCAM, FH, FLCN, GATA2, GPC3, GREM1, HOXB13, HRAS, KIT, MAX, MEN1, MET, MITF, MLH1, MSH2, MSH3, MSH6, MUTYH, NBN, NF1, NF2, NTHL1, PALB2, PDGFRA, PHOX2B, PMS2, POLD1, POLE, POT1, PRKAR1A, PTCH1, PTEN, RAD50, RAD51C, RAD51D, RB1, RECQL4, RET, RUNX1, SDHA, SDHAF2, SDHB, SDHC, SDHD, SMAD4, SMARCA4, SMARCB1, SMARCE1, STK11, SUFU, TERC, TERT, TMEM127, TP53, TSC1, TSC2, VHL, WRN, WT1.  A variant of uncertain significance (VUS) called RECQL4 c.212A>C (p.Glu71Ala) was also noted. At this time, it is unknown if this variant is associated with increased cancer risk or if this is a normal finding, but most variants such as this get reclassified to being inconsequential. It should not be used to make medical management decisions. With time, we suspect the lab will determine the significance of this variant, if any. If we do learn more about it, we will try to contact Mr. Graca to discuss it further. However, it is important to stay in touch with [place] periodically and keep the address and phone number up to date.  The test report will be scanned into EPIC and will be located under the Molecular Pathology section of the Results Review tab.A portion of the result report is included below for reference.      We discussed with Mr. Zulauf that since the current genetic testing is not perfect, it is possible there may be a gene mutation in one of these genes that current testing cannot detect, but that chance is small. We also discussed, that it is possible that another gene that has not yet been discovered, or that we have not yet tested, is responsible for the cancer diagnoses in the family. Therefore, important to remain in touch with cancer genetics in the future so that we can  continue to offer Mr. Kluttz the most up to date genetic testing.   CANCER SCREENING RECOMMENDATIONS: Given Mr. Heinkel's personal and family histories, we must interpret these negative results with some caution.  Families with features suggestive of hereditary risk for cancer tend to have multiple family members with cancer, diagnoses in multiple generations and diagnoses before the age of 14. Mr. Gracey family exhibits some of these features. Thus this result may simply reflect our current inability to detect all mutations within these genes or there may be a different gene that has not yet been discovered or tested.   Because no causative or actionable mutations were identified through Mr. Kortz testing, his cancer screening plan should be based on his own personal history of cancer, family history, and age-based cancer screening guidelines for the general population. Certainly I defer to Mr. Hartl's oncology and primary care providers regarding a definitive cancer management and screening plan. However, my recommendations based on Mr. Lofstrom's family history include:  1) He undergo colonoscopy as he has never had one.  2) The frequency of his colonoscopies should be determined by  findings (or lack thereof) of his first colonoscopy. However, based on family history, NCCN guidelines state that colonoscopy at least every 5 years is indicated.   RECOMMENDATIONS FOR FAMILY MEMBERS: Individuals in this family are thought to be at increased risk for the cancers observed in the family. Each of Mr. Boeding's family members should consult their physicians regarding a personalized cancer screening plan.  It appears that many of Mr. Kocsis's family members (siblings, nieces, nephews, etc) are candidates for genetic testing. If his family members would like to learn more about their hereditary cancer risks and genetic testing options, they should consult their primary care providers and/or a genetic counselor.  Mr.  Muska will let us know if we can be of any assistance in coordinating genetic counseling and/or testing for family members.   FOLLOW-UP: Lastly, we discussed with Mr. Sexson that cancer genetics is a rapidly advancing field and it is possible that new genetic tests will be appropriate for him and/or his family members in the future. We encouraged him to remain in contact with cancer genetics on an annual basis so we can update his personal and family histories and let him know of advances in cancer genetics that may benefit this family.   Our contact number was provided. Mr. Mihelich questions were answered to his satisfaction, and he knows he is welcome to call us at anytime with additional questions or concerns.   Mal Misty, MS, [facility] [email or web address] Naval [email or web address]

## 2017-07-10 ENCOUNTER — Other Ambulatory Visit: Payer: BLUE CROSS/BLUE SHIELD

## 2017-07-10 ENCOUNTER — Ambulatory Visit: Payer: BLUE CROSS/BLUE SHIELD

## 2017-07-25 ENCOUNTER — Ambulatory Visit: Payer: BLUE CROSS/BLUE SHIELD

## 2017-07-25 ENCOUNTER — Ambulatory Visit: Payer: BLUE CROSS/BLUE SHIELD | Admitting: Oncology

## 2017-07-25 ENCOUNTER — Other Ambulatory Visit: Payer: BLUE CROSS/BLUE SHIELD

## 2017-07-30 DIAGNOSIS — C3490 Malignant neoplasm of unspecified part of unspecified bronchus or lung: Secondary | ICD-10-CM

## 2017-07-30 HISTORY — DX: Malignant neoplasm of unspecified part of unspecified bronchus or lung: C34.90

## 2017-08-25 HISTORY — PX: THORACOSCOPY: SUR1347

## 2017-10-23 ENCOUNTER — Telehealth: Payer: Self-pay | Admitting: *Deleted

## 2017-10-23 NOTE — Telephone Encounter (Signed)
"  This is Nevin Bloodgood calling from [facility].  Message left yesterday about my patient who lives closer to you.  He has been a patient there.  Requesting is it possible for patient to have his port-a-cath flushed there or does he need to be established there.  Please return call to [phone]."   Most recent [facility] visit with Dr. Alen Blew was on 06-27-2017.  Routing call information to collaborative nurse and provider for review.  Further patient communication through collaborative nurse.

## 2017-10-23 NOTE — Telephone Encounter (Signed)
Ok to schedule flush appointments here.

## 2017-10-23 NOTE — Telephone Encounter (Signed)
This RN returned Paula's phone call regarding port-a-cath flushes. Per Dr. Alen Blew, he can come to [facility] for flushes. Instructed Nevin Bloodgood to have patient call [phone] and ask for scheduling. He can arrange the appointments for flushes. Elyn Aquas to call [phone] if she had any more questions or concerns.

## 2017-10-24 ENCOUNTER — Ambulatory Visit (HOSPITAL_BASED_OUTPATIENT_CLINIC_OR_DEPARTMENT_OTHER): Payer: BLUE CROSS/BLUE SHIELD

## 2017-10-24 ENCOUNTER — Telehealth: Payer: Self-pay | Admitting: Oncology

## 2017-10-24 DIAGNOSIS — C439 Malignant melanoma of skin, unspecified: Secondary | ICD-10-CM

## 2017-10-24 DIAGNOSIS — Z95828 Presence of other vascular implants and grafts: Secondary | ICD-10-CM

## 2017-10-24 DIAGNOSIS — Z452 Encounter for adjustment and management of vascular access device: Secondary | ICD-10-CM

## 2017-10-24 MED ORDER — HEPARIN SOD (PORK) LOCK FLUSH 100 UNIT/ML IV SOLN
500.0000 [IU] | Freq: Once | INTRAVENOUS | Status: AC
  Administered 2017-10-24: 500 [IU]
  Filled 2017-10-24: qty 5

## 2017-10-24 MED ORDER — SODIUM CHLORIDE 0.9% FLUSH
10.0000 mL | Freq: Once | INTRAVENOUS | Status: AC
  Administered 2017-10-24: 10 mL
  Filled 2017-10-24: qty 10

## 2017-10-24 NOTE — Telephone Encounter (Signed)
Spoke with patient's wife and scheduled flush per 10/26 los.

## 2017-11-12 ENCOUNTER — Telehealth: Payer: Self-pay | Admitting: Oncology

## 2017-11-12 NOTE — Telephone Encounter (Signed)
Patients wife came in to schedule next 4wks flush

## 2017-11-21 ENCOUNTER — Ambulatory Visit (HOSPITAL_BASED_OUTPATIENT_CLINIC_OR_DEPARTMENT_OTHER): Payer: BLUE CROSS/BLUE SHIELD

## 2017-11-21 VITALS — BP 150/82 | HR 75 | Temp 98.0°F | Resp 17

## 2017-11-21 DIAGNOSIS — Z452 Encounter for adjustment and management of vascular access device: Secondary | ICD-10-CM

## 2017-11-21 DIAGNOSIS — C439 Malignant melanoma of skin, unspecified: Secondary | ICD-10-CM | POA: Diagnosis not present

## 2017-11-21 DIAGNOSIS — Z95828 Presence of other vascular implants and grafts: Secondary | ICD-10-CM

## 2017-11-21 MED ORDER — HEPARIN SOD (PORK) LOCK FLUSH 100 UNIT/ML IV SOLN
500.0000 [IU] | Freq: Once | INTRAVENOUS | Status: AC
  Administered 2017-11-21: 500 [IU]
  Filled 2017-11-21: qty 5

## 2017-11-21 MED ORDER — SODIUM CHLORIDE 0.9% FLUSH
10.0000 mL | Freq: Once | INTRAVENOUS | Status: AC
  Administered 2017-11-21: 10 mL
  Filled 2017-11-21: qty 10

## 2017-12-19 ENCOUNTER — Ambulatory Visit (HOSPITAL_BASED_OUTPATIENT_CLINIC_OR_DEPARTMENT_OTHER): Payer: BLUE CROSS/BLUE SHIELD

## 2017-12-19 VITALS — BP 135/83 | HR 64 | Temp 98.2°F | Resp 18

## 2017-12-19 DIAGNOSIS — Z95828 Presence of other vascular implants and grafts: Secondary | ICD-10-CM

## 2017-12-19 DIAGNOSIS — Z452 Encounter for adjustment and management of vascular access device: Secondary | ICD-10-CM

## 2017-12-19 DIAGNOSIS — C439 Malignant melanoma of skin, unspecified: Secondary | ICD-10-CM | POA: Diagnosis not present

## 2017-12-19 MED ORDER — HEPARIN SOD (PORK) LOCK FLUSH 100 UNIT/ML IV SOLN
500.0000 [IU] | Freq: Once | INTRAVENOUS | Status: AC
  Administered 2017-12-19: 500 [IU]
  Filled 2017-12-19: qty 5

## 2017-12-19 MED ORDER — SODIUM CHLORIDE 0.9% FLUSH
10.0000 mL | Freq: Once | INTRAVENOUS | Status: AC
  Administered 2017-12-19: 10 mL
  Filled 2017-12-19: qty 10

## 2017-12-31 ENCOUNTER — Ambulatory Visit
Admission: RE | Admit: 2017-12-31 | Discharge: 2017-12-31 | Disposition: A | Discharge status: Home / Self Care | Payer: Medicare Other | Source: Ambulatory Visit | Attending: Radiation Oncology | Admitting: Radiation Oncology

## 2017-12-31 ENCOUNTER — Ambulatory Visit
Admission: RE | Admit: 2017-12-31 | Discharge: 2017-12-31 | Disposition: A | Discharge status: Home / Self Care | Payer: Self-pay | Source: Ambulatory Visit | Attending: Radiation Oncology | Admitting: Radiation Oncology

## 2017-12-31 ENCOUNTER — Other Ambulatory Visit: Payer: Self-pay | Admitting: Radiation Oncology

## 2017-12-31 DIAGNOSIS — C801 Malignant (primary) neoplasm, unspecified: Secondary | ICD-10-CM

## 2017-12-31 NOTE — Progress Notes (Addendum)
Histology and Location of Primary Cancer:  01/23/16. R axillary lymph node dissection level 1-3. 1/24 lymph nodes showed metastatic melanoma (2.5-cm in dimension no TILs). 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  Location of Symptomatic tumor: Right Axilla  Past/Anticipated chemotherapy by medical oncology, if any:  Dr. Alen Blew documents on 06/27/17: Nivolumab 240 mg every 2 weeks started on 05/02/2017. He is here for the next treatment. He is here for cycle 5 of treatment.  Per Epic his last dose of Nivolumab was 06/27/17.   Patient's main complaints related to symptomatic tumor are: He denies any complaints related to his Right Axilla  Pain on a scale of 0-10 is: He reports his pain today is a 5/10 to his bilateral hands (joint pain)  Ambulatory status? Walker? Wheelchair?: Ambulatory.   SAFETY ISSUES:  Prior radiation? No  Pacemaker/ICD? No  Possible current pregnancy? N/A  Is the patient on methotrexate? No  Additional Complaints / other details:   12/11/17 Dr. Phoebe Sharps (Radiation Oncology at [facility]) RECOMMENDATIONS: 1. CANCER TREATMENT: We discussed the role of RT in melanoma. Given his clinical history with localized disease to the axilla not responsive to immunotherapy we recommend comprehensive nodal irradiation to the R axilla. We discussed the logistics of treatment including daily treatment M-F for ~4 weeks. Side effects would likely be limited to fatigue and skin irritation, although we discussed potential for worsening of lymphedema and pneumonitis. Given the duration of treatment the family would prefer to have treatment locally at [facility]- we will help arrange for referral 2. Lung cancer history: His most recent CT chest does show GGOs in the R lung- given his history of lung adenocarcinoma this could certainly represent an indolent malignant process. We  would recommend continued observation with treatment if there is continued growth. 3. Prostate cancer history: S/p prostatectomy- reports PSA is being followed with no evidence of recurrence.   ---05/05/2009; R lateral/flank lesion that looked like lipoma had been present ever since. S/p diagnostic excision, histopathologic details are unknown  -Early 2013; rapid growth to a size of 13-cm of this exophytic mass. S/p excision in 07/30/12. 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  -08/14/12. benign-appearing subcentimeter scattered minimally FDG-avid cervical lymph nodes, a stable subcentimeter non-FDG-avid cutaneous lesion on the R cheek, stable subcentimeter mildly FDG-avid bilateral axillary lymph node, a stable 1-cm mildly FDG-avid nodule in the superior segment of the LLL.  -08/28/12. Wide excision of the R flank melanoma. Negative for residual melanoma.  -02/18/13. Restaging CT scan IV contrast. ill-defined 1.2-cm ground glass nodule in the superior segment L LL stable shotty bilateral axillary lymph nodules. Referred to thorasic surgery in 03/02/13 and felt he should be observed for now.  -12/27/15. growing R axillary lymphadenopathy. S/p core biopsy c/w metastatic melanoma.  -01/23/16. R axillary lymph node dissection level 1-3. 1/24 lymph nodes showed metastatic melanoma (2.5-cm in dimension no TILs).  -03/19/17. R axillary lymph node removal for growing lymph node. C/w metastatic melanoma, absent TILs.  -5/4-6/29/2018. S/p 4 infusions of nivolumab.  -07/04/17. Restaging PET/CT scan shows new R axillary growth and stable-to-growing lung nodules.  -08/25/17. S/p wedge resection for growing lung nodules. C/w NSCLC adeno type.  09/2017- Foundation one testing- CDKN2A loss, CKN2B loss  12/05/2017- CT Chest/Abd/Pelvis -stable soft tissue ndoules in R axilla, R chest wall with increased stranding in adjacent soft tissues concerning for recurrent  disease. A new focus  of soft tissue stranding in L chest wall of unclear etiology -R apical GGO is increased from prior. Stable small RML GGOs  BP 118/74   Pulse 68   Temp 98.3 F (36.8 C)   Ht 5\' 8"  (1.727 m)   Wt 200 lb 9.6 oz (91 kg)   SpO2 99% Comment: room air  BMI 30.50 kg/m    Wt Readings from Last 3 Encounters:  01/06/18 200 lb 9.6 oz (91 kg)  06/27/17 215 lb 3.2 oz (97.6 kg)  06/13/17 214 lb 11.2 oz (97.4 kg)

## 2018-01-01 ENCOUNTER — Other Ambulatory Visit: Payer: Self-pay | Admitting: Radiation Oncology

## 2018-01-01 DIAGNOSIS — C801 Malignant (primary) neoplasm, unspecified: Secondary | ICD-10-CM

## 2018-01-06 ENCOUNTER — Ambulatory Visit
Admission: RE | Admit: 2018-01-06 | Discharge: 2018-01-06 | Disposition: A | Discharge status: Home / Self Care | Payer: Managed Care, Other (non HMO) | Source: Ambulatory Visit | Attending: Radiation Oncology | Admitting: Radiation Oncology

## 2018-01-06 ENCOUNTER — Encounter: Payer: Self-pay | Admitting: Radiation Oncology

## 2018-01-06 DIAGNOSIS — Z85820 Personal history of malignant melanoma of skin: Secondary | ICD-10-CM | POA: Insufficient documentation

## 2018-01-06 DIAGNOSIS — I10 Essential (primary) hypertension: Secondary | ICD-10-CM | POA: Diagnosis not present

## 2018-01-06 DIAGNOSIS — Z79899 Other long term (current) drug therapy: Secondary | ICD-10-CM | POA: Insufficient documentation

## 2018-01-06 DIAGNOSIS — Z801 Family history of malignant neoplasm of trachea, bronchus and lung: Secondary | ICD-10-CM | POA: Diagnosis not present

## 2018-01-06 DIAGNOSIS — C439 Malignant melanoma of skin, unspecified: Secondary | ICD-10-CM

## 2018-01-06 DIAGNOSIS — Z8041 Family history of malignant neoplasm of ovary: Secondary | ICD-10-CM | POA: Insufficient documentation

## 2018-01-06 DIAGNOSIS — Z87891 Personal history of nicotine dependence: Secondary | ICD-10-CM | POA: Diagnosis not present

## 2018-01-06 DIAGNOSIS — C761 Malignant neoplasm of thorax: Secondary | ICD-10-CM

## 2018-01-06 DIAGNOSIS — Z800 Family history of malignant neoplasm of digestive organs: Secondary | ICD-10-CM | POA: Diagnosis not present

## 2018-01-06 DIAGNOSIS — Z510 Encounter for antineoplastic radiation therapy: Secondary | ICD-10-CM | POA: Insufficient documentation

## 2018-01-06 DIAGNOSIS — Z803 Family history of malignant neoplasm of breast: Secondary | ICD-10-CM | POA: Diagnosis not present

## 2018-01-06 DIAGNOSIS — C773 Secondary and unspecified malignant neoplasm of axilla and upper limb lymph nodes: Secondary | ICD-10-CM | POA: Insufficient documentation

## 2018-01-06 DIAGNOSIS — Z8042 Family history of malignant neoplasm of prostate: Secondary | ICD-10-CM | POA: Diagnosis not present

## 2018-01-06 HISTORY — DX: Malignant neoplasm of unspecified part of unspecified bronchus or lung: C34.90

## 2018-01-06 NOTE — Progress Notes (Signed)
Radiation Oncology         [phone] ________________________________  Initial outpatient Consultation  Name: William Wilkerson MRN: [id number]  Date: 01/06/2018  DOB: [birth year]  MA:UQJFH, [name], MD  Aundra Millet, MD   REFERRING PHYSICIAN: Aundra Millet, MD  DIAGNOSIS:    ICD-10-CM   1. Cancer of axilla [facility]) C76.1    Cancer Staging Cancer of axilla [facility]) Staging form: Melanoma of the Skin, AJCC 8th Edition - Clinical stage from 01/07/2018: Stage III (cT4b, cN2b, cM0) - Signed by Eppie Gibson, MD on 01/07/2018   CHIEF COMPLAINT: Here to discuss management of metastatic melanoma to the right axilla.  HISTORY OF PRESENT ILLNESS::William Wilkerson is a 67 y.o. male who presents with metastatic melanoma to the right axillary lymph node. Patient reported a right lateral/flank lesion that looked like lipoma for which he underwent diagnostic excision on 05/05/09. Histopathological details are unknown. In early 2013 the patient reported a rapid growth to a size of 13 cm of this exophytic mass. He underwent excision on 07/30/12. The mass was considered to be either undifferentiated pleomorphic sarcoma vs. desmoplastic melanoma vs. peripheral nerve sheath tumor. On 08/14/12 a benign-appearing subcentimenter scattered minimally FDG-avid cervical lymph nodes, a stable subcentimeter non-FDG-avid cutaneous lesion on the right cheek, stable subcentimeter mildly FDG-avid bilateral axillary lymph node, a stable 1 cm mildly FDG-avid nodule in the superior segment of the LLL were noted. The patient underwent wide excision of the right flank melanoma on 08/28/17. This was negative for residual melanoma. The patient underwent restaging CT scan on 02/18/13. This showed an ill-defined 1.2 cm ground glass nodule in the superior segment of the left lower lung stable shotty bilateral axillary lymph nodules. The patient was referred to thoracic surgery on 03/02/13 and underwent active surveillance.   The patient  reported growing right axillary lymphadenopathy on 12/27/15. Biopsy at this time showed metastatic melanoma. On 01/23/16 dissection of the right axillary lymph node showed 1/24 lymph nodes positive for metastatic melanoma. The positive lymph node was 2.5 cm in dimension with no TILs. The patient underwent right axillary lymph node removal on 03/19/17 due to continued growth. The patient underwent four infusions of Nivolumab from 05/02/17-06/27/17. Restaging PET scan on 07/04/17 showed new right axillary growth and stable-to-growing lung nodules. On 08/25/17 the patient underwent wedge resection for growing lung nodules. Pathology was NSCLC adeno type. Genetic testing in October 2018 showed CDKN2A loss, and CKN2B loss. Patient underwent chest/abd/pelvis CT on 12/05/17. This revealed stable soft tissue nodules in the right axilla. The right chest wall had increased stranding in adjacent soft tissues concerning for recurrent disease. A new focus of soft tissues stranding in the left chest wall of unclear etiology. The right apical ground glass opacity was increased from prior. The patient presented to Dr. Clance Boll (Surgical Oncology at [facility]) on 12/05/17. Per his note, he recommended the patient consider radiation to the right axilla due to his "aggressive" mutations. He also scheduled an 8 week follow-up with restaging studies to evaluate several suspicious lesions in the lungs. The patient was referred to Dr. Phoebe Sharps (Radiation Oncology at [facility]) on 12/11/17. Per his note, he recommended comprehensive nodal irradiation to the right axilla. He also recommended continued observation of ground glass opacities in the right lung. The patient was interested in having treatment locally at [facility]. Dr. Phoebe Sharps referred the patient here so he is able to discuss the role of radiation therapy and proceed with treatment in a more local setting.  Patient reports surgical oncology is  not favoring any further resection of axilla at this  time  The patient's axillary surgeon Is Andi Devon, his thoracic surgeon is Waldemar Dickens, and his med oncologist is [place], Dr Alen Blew at [place].  On review of symptoms, the patient reports 5/10 pain in the bilateral hands related to joint pain. He denies complaints related to his right axilla. Since his axillary surgery, the patient reported right hand and arm adenopathy, but chest tube placed after lobectomy actually helped resolve upper extremity swelling. Patient reports decreased appetite resulting in 15 lb weight loss in 6 months. Patient notes after thoracic surgery he has occasional chest pain and a cough. He notes occasional left hand numbness. Patient denies vision changes, headaches, difficulty swallowing, shortness of breath, hemoptysis, urinary difficulties, swelling in extremities, or rashes.    PREVIOUS RADIATION THERAPY: No  PAST MEDICAL HISTORY:  has a past medical history of Cancer ([place]) (2008), Genetic testing (07/04/2017), Hypertension, Lung cancer ([place]) (07/2017), and Melanoma ([place]) (2013).    PAST SURGICAL HISTORY: Past Surgical History:  Procedure Laterality Date  . IR FLUORO GUIDE PORT INSERTION RIGHT  05/27/2017  . IR US GUIDE VASC ACCESS RIGHT  05/27/2017  . MELANOMA EXCISION Right    x4-abd & axillary  . PROSTATE SURGERY  2008   cancer  . ROTATOR CUFF REPAIR Left 1988  . THORACOSCOPY Left 08/25/2017   Thoracoscopy with Lobectomy Mediastinal and Regional Lymphadenectomy at [facility] by Dr. Waldemar Dickens    FAMILY HISTORY: family history includes Breast cancer in his sister; Colon cancer in his brother and brother; Lung cancer in his brother and sister; Ovarian cancer in his other; Ovarian cancer (age of onset: 18) in his mother; Prostate cancer in his brother.  SOCIAL HISTORY:  reports that he quit smoking about 34 years ago. He has a 5.00 pack-year smoking history. he has never used smokeless tobacco. He reports that he does not drink alcohol or use  drugs.   ALLERGIES: Codeine; Hydromorphone; Morphine; and Percocet [oxycodone-acetaminophen]  MEDICATIONS:  Current Outpatient Medications  Medication Sig Dispense Refill  . DULoxetine (CYMBALTA) 20 MG capsule Take 20 mg by mouth daily.    Marland Kitchen ibuprofen (ADVIL,MOTRIN) 800 MG tablet Take 800 mg by mouth every 8 (eight) hours as needed for mild pain.    . indomethacin (INDOCIN) 50 MG capsule Take 50 mg by mouth 3 (three) times daily as needed.    . metoprolol succinate (TOPROL-XL) 50 MG 24 hr tablet Take 50 mg by mouth daily. Take with or immediately following a meal.    . zolpidem (AMBIEN CR) 6.25 MG CR tablet Take 6.25 mg by mouth at bedtime as needed for sleep.    Marland Kitchen allopurinol (ZYLOPRIM) 100 MG tablet Take 100 mg by mouth daily.  3  . lidocaine-prilocaine (EMLA) cream Apply generous amount to portacath 1 hr prior to infusion.  DO  NOT RUB IN. (Patient not taking: Reported on 01/06/2018) 30 g 0  . lisinopril (PRINIVIL,ZESTRIL) 40 MG tablet Take 40 mg by mouth daily.  1   No current facility-administered medications for this encounter.     REVIEW OF SYSTEMS:  A 10+ POINT REVIEW OF SYSTEMS WAS OBTAINED including neurology, dermatology, psychiatry, cardiac, respiratory, lymph, extremities, GI, GU, Musculoskeletal, constitutional, breasts,  Skin, HEENT.  All pertinent positives are noted in the HPI.  All others are negative. Marland Kitchen   PHYSICAL EXAM:  height is 5\' 8"  (1.727 m) and weight is 200 lb 9.6 oz (91 kg). His temperature is 98.3 F (  36.8 C). His blood pressure is 118/74 and his pulse is 68. His oxygen saturation is 99%.   General: Alert and oriented, in no acute distress HEENT: Head is normocephalic. Extraocular movements are intact. Oropharynx is clear. Upper dentures noted. Oral cavity is clear. Dentures were not removed.  Neck: Neck is supple, no palpable cervical or supraclavicular lymphadenopathy.   Heart: Regular in rate and rhythm with no murmurs, rubs, or gallops. Ross cath noted.    Chest: Clear to auscultation bilaterally, with no rhonchi, wheezes, or rales. No palpable lesions over the right chest wall. However, inferior to the right axillary scar at least one superficial, mobile mass measuring 2 cm in greatest dimension. No other right axillary masses appreciated.  Abdomen: Soft, nontender, nondistended, with no rigidity or guarding. Extremities: RUE edema.  Lymphatics: see Neck Exam. Lymphedema throughout the right arm. See chest for axilla. Skin: No concerning lesions. Long scar at the right flank from melanoma excision. Scars on the left chest from lung surgery. No concerning moles in torso at this time.  Musculoskeletal: symmetric strength and muscle tone throughout. No palpable masses in the antecubital regions of the right arm. Neurologic: Cranial nerves II through XII are grossly intact. No obvious focalities. Speech is fluent. Coordination is intact. Psychiatric: Judgment and insight are intact. Affect is appropriate.   ECOG = 1  0 - Asymptomatic (Fully active, able to carry on all predisease activities without restriction)  1 - Symptomatic but completely ambulatory (Restricted in physically strenuous activity but ambulatory and able to carry out work of a light or sedentary nature. For example, light housework, office work)  2 - Symptomatic, <50% in bed during the day (Ambulatory and capable of all self care but unable to carry out any work activities. Up and about more than 50% of waking hours)  3 - Symptomatic, >50% in bed, but not bedbound (Capable of only limited self-care, confined to bed or chair 50% or more of waking hours)  4 - Bedbound (Completely disabled. Cannot carry on any self-care. Totally confined to bed or chair)  5 - Death   Eustace Pen MM, Creech RH, Tormey DC, et al. [phone]). "Toxicity and response criteria of the [facility]". [place] Oncol. 5 (6): 649-55   LABORATORY DATA:  Lab Results  Component Value Date    WBC 4.8 06/27/2017   HGB 12.5 (L) 06/27/2017   HCT 35.7 (L) 06/27/2017   MCV 88.3 06/27/2017   PLT 361 06/27/2017   CMP     Component Value Date/Time   NA 140 06/27/2017 1307   K 3.5 06/27/2017 1307   CO2 27 06/27/2017 1307   GLUCOSE 88 06/27/2017 1307   BUN 9.6 06/27/2017 1307   CREATININE 1.3 06/27/2017 1307   CALCIUM 9.1 06/27/2017 1307   PROT 7.3 06/27/2017 1307   ALBUMIN 4.1 06/27/2017 1307   AST 21 06/27/2017 1307   ALT 17 06/27/2017 1307   ALKPHOS 61 06/27/2017 1307   BILITOT 0.98 06/27/2017 1307         RADIOGRAPHY: as above - imaged review by me     IMPRESSION/PLAN: William Wilkerson is a pleasant 67 y.o. gentleman with metastatic melanoma to the right axilla, stage III.  I discussed the role of radiation therapy as part of the management for metastatic melanoma. We discussed the nature of melanoma and chances of recurrence with RT alone. We discussed the risks, benefits, short, and long term effects of radiation therapy. I agree with Dr. Sharlene Motts recommendation  and anticipate 4 weeks of radiotherapy directed to the right axilla. I will like to be sure that surgical oncology is against debulking of gross disease in the axilla, first, as melanoma is relatively radioresistant when gross disease is treated conventionally.  The patient agrees to proceed with treatment and a consent form was signed. A copy was placed in the patient's chart.  I will contact Dr. Freddi Che prior to radiation planning due to the superficial, mobile mass in the right axilla palpated during the physical exam. I will contact the patient if surgery is anticipated. CT simulation and treatment planning will be scheduled after this discussion.   I am unable to view the PET portions of the patient's imaging despite efforts of staff to obtain that. I will ask them to request Fed-Exing of CD.  I offered the patient a referral to physical therapy for a lymphedema sleeve if indicated for the right arm. He is somewhat  deconditioned, too.   _____________________   Eppie Gibson, MD  This document serves as a record of services personally performed by Eppie Gibson, MD. It was created on her behalf by Bethann Humble, a trained medical scribe. The creation of this record is based on the scribe's personal observations and the provider's statements to them. This document has been checked and approved by the attending provider.

## 2018-01-07 ENCOUNTER — Other Ambulatory Visit: Payer: Self-pay | Admitting: Radiation Oncology

## 2018-01-07 ENCOUNTER — Encounter: Payer: Self-pay | Admitting: Radiation Oncology

## 2018-01-07 ENCOUNTER — Telehealth: Payer: Self-pay | Admitting: *Deleted

## 2018-01-07 DIAGNOSIS — C761 Malignant neoplasm of thorax: Secondary | ICD-10-CM | POA: Insufficient documentation

## 2018-01-07 NOTE — Telephone Encounter (Signed)
CALLED [facility] TO ARRANGE A PHYSICAL THERAPY APPT. FOR THIS PT., LVM FOR A RETURN CALL

## 2018-01-09 ENCOUNTER — Ambulatory Visit: Payer: Managed Care, Other (non HMO) | Admitting: Radiation Oncology

## 2018-01-12 ENCOUNTER — Ambulatory Visit
Admission: RE | Admit: 2018-01-12 | Discharge: 2018-01-12 | Disposition: A | Discharge status: Home / Self Care | Payer: Managed Care, Other (non HMO) | Source: Ambulatory Visit | Attending: Radiation Oncology | Admitting: Radiation Oncology

## 2018-01-12 ENCOUNTER — Encounter: Payer: Self-pay | Admitting: Radiation Oncology

## 2018-01-12 DIAGNOSIS — Z510 Encounter for antineoplastic radiation therapy: Secondary | ICD-10-CM | POA: Diagnosis not present

## 2018-01-12 DIAGNOSIS — C761 Malignant neoplasm of thorax: Secondary | ICD-10-CM

## 2018-01-13 ENCOUNTER — Other Ambulatory Visit: Payer: Self-pay

## 2018-01-13 ENCOUNTER — Ambulatory Visit: Payer: Managed Care, Other (non HMO) | Attending: Radiation Oncology | Admitting: Physical Therapy

## 2018-01-13 DIAGNOSIS — I890 Lymphedema, not elsewhere classified: Secondary | ICD-10-CM

## 2018-01-13 DIAGNOSIS — Z85820 Personal history of malignant melanoma of skin: Secondary | ICD-10-CM | POA: Insufficient documentation

## 2018-01-13 NOTE — Therapy (Addendum)
[place], [name], [address] Phone: [phone]   Fax:  [phone]  Physical Therapy Evaluation  Patient Details  Name: William Wilkerson MRN: [id number] Date of Birth: December 23, 1951 Referring Provider: Dr Eppie Gibson   Encounter Date: 01/13/2018  PT End of Session - 01/13/18 1738    Visit Number  1    Number of Visits  4    Date for PT Re-Evaluation  03/13/18    PT Start Time  1608    PT Stop Time  1700    PT Time Calculation (min)  52 min    Activity Tolerance  Patient tolerated treatment well    Behavior During Therapy  [facility] for tasks assessed/performed       Past Medical History:  Diagnosis Date  . Cancer [facility]) 2008   prostate  . Genetic testing 07/04/2017   Mr. Provencal underwent genetic counseling and testing for hereditary cancer syndromes on 06/12/2017. His results were negative for mutations in all 83 genes analyzed by [facility]'s 83-gene Multi-Cancers Panel. Genes analyzed include: ALK, APC, ATM, AXIN2, BAP1, BARD1, BLM, BMPR1A, BRCA1, BRCA2, BRIP1, CASR, CDC73, CDH1, CDK4, CDKN1B, CDKN1C, CDKN2A, CEBPA, CHEK2, CTNNA1, DICER1, DIS3L2, EGFR, EPCAM, FH  . Hypertension   . Lung cancer ([place]) 07/2017  . Melanoma ([place]) 2013   x 4    Past Surgical History:  Procedure Laterality Date  . IR FLUORO GUIDE PORT INSERTION RIGHT  05/27/2017  . IR US GUIDE VASC ACCESS RIGHT  05/27/2017  . MELANOMA EXCISION Right    x4-abd & axillary  . PROSTATE SURGERY  2008   cancer  . ROTATOR CUFF REPAIR Left 1988  . THORACOSCOPY Left 08/25/2017   Thoracoscopy with Lobectomy Mediastinal and Regional Lymphadenectomy at [facility] by Dr. Marland Kitchen Haithcock    There were no vitals filed for this visit.   Subjective Assessment - 01/13/18 1612    Subjective  Dr. Isidore Moos sent me.  Pt. doesn't feel like he needs therapy, but says the doctor does. Referral is for lymphedema and deconditioning, but pt. denies fatigue at this time; also  says his lymphedema has been improved since having drain in for lung lobectomy. Neck and shoulders hurt all the time from bone spurs.    Pertinent History  History of metastatic melanoma diagnosed in 2013 with resection of that, located in right flank.  Cancer metastasized as seen by scans, so had right axillary node dissection in January 2017.  Another tumor was found at axilla so he had chemo last summer, finishing in June 2018 (six treatment every two weeks). He had lung cancer diagnosed and had left lower lobectomy in August 2018. Now he will have radiation to right axilla starting 01/19/18; will have 20 treatments. He said his right arm swelling drained when he had the lung cancer surgery drain in.  He never got a compression garment and has done nothing to treat the right UE lymphedema. HTN controlled with meds. h/o gout left foot and hand.    Patient Stated Goals  do what Dr. Isidore Moos says--be proactive about lymphedema    Currently in Pain?  Yes    Pain Score  4     Pain Location  Finger (Comment which one) and left wrist    Pain Orientation  Right;Proximal    Pain Descriptors / Indicators  Aching    Aggravating Factors   stormy weathier    Pain Relieving Factors  advil    Multiple Pain Sites  Yes  Pain Score  4    Pain Location  Neck and shoulders    Pain Orientation  Right;Left    Pain Descriptors / Indicators  Aching;Sore    Aggravating Factors   tension    Pain Relieving Factors  self-massage         [facility] PT Assessment - 01/13/18 0001      Assessment   Medical Diagnosis  metastatic melanoma    Referring Provider  Dr Eppie Gibson    Hand Dominance  Right    Prior Therapy  none recently; was seen here 1.5 years ago      Precautions   Precautions  Other (comment)    Precaution Comments  cancer precautions      Restrictions   Weight Bearing Restrictions  No      Balance Screen   Has the patient fallen in the past 6 months  No    Has the patient had a decrease in activity  level because of a fear of falling?   No    Is the patient reluctant to leave their home because of a fear of falling?   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--"you have to put your back into it sometimes"    Leisure  just working; gets in and out of the truck 10 times a day      Cognition   Overall Cognitive Status  Within Functional Limits for tasks assessed        LYMPHEDEMA/ONCOLOGY QUESTIONNAIRE - 01/13/18 1631      Type   Cancer Type  metastatic melanoma      Surgeries   Number Lymph Nodes Removed  28      Treatment   Active Radiation Treatment  No will start 01/19/18 with axilla targeted      What other symptoms do you have   Are you having pitting edema  Yes    Body Site  right arm    Stemmer Sign  No      Lymphedema Assessments   Lymphedema Assessments  Upper extremities      Right Upper Extremity Lymphedema   10 cm Proximal to Olecranon Process  35.7 cm    Olecranon Process  32.6 cm    10 cm Proximal to Ulnar Styloid Process  29 cm    Just Proximal to Ulnar Styloid Process  21.1 cm    Across Hand at [employer]  24.5 cm    At [place] of 2nd Digit  7.9 cm      Left Upper Extremity Lymphedema   10 cm Proximal to Olecranon Process  33.5 cm    Olecranon Process  31 cm    10 cm Proximal to Ulnar Styloid Process  25.2 cm    Just Proximal to Ulnar Styloid Process  19 cm    Across Hand at [employer]  23.6 cm    At [place] of 2nd Digit  7.7 cm          Objective measurements completed on examination: See above findings.  PT Education - 01/13/18 1736    Education provided  Yes    Education Details  about how and where to obtain a  flat knit compression sleeve and glove and to wear it every day for at least six months; "Why exercise?" flyer, CURE article on staying active during cancer treatment    Person(s) Educated  Patient    Methods  Explanation;Handout    Comprehension  Verbalized understanding          PT Long Term Goals - 01/13/18 1753      PT LONG TERM GOAL #1   Title  Pt. will obtain a compression sleeve and glove for right UE.    Time  4    Period  Weeks    Status  New      PT LONG TERM GOAL #2   Title  Pt. will have begun a regular exercise program with goal of 30 minutes 5 days a week of walking or other exercise.    Time  4    Period  Weeks    Status  New             Plan - 01/13/18 1738    Clinical Impression Statement  This patient is known to this clinic from previous treatment about a year and a half ago for right UE lymphedema.  He did not get compression garments at that time, nor has he done any self-care for his lymphedema since then.  He feels that his arm is doing pretty well, and that the swelling reduced during the time he had a drain in following left lower lobectomy for lung cancer.  He has had another recurrence of his melanoma and will start XRT on 01/19/18 for that; treatment will be to right axilla, per pt.  He has come in today because Dr. Isidore Moos suggested it, but does not feel he has much need for therapy.  He was given instruction today about how and where to obtain a compression sleeve and glove for his right arm, and told in firm language that it would be risky now not to wear these garments, because the radiation treatment he is scheduled to receive could worsen his swelling during or after treatment.  He was also referred for deconditioning.  Currently he denies fatigue or problems with this, but he was educated about the benefits of exercise, how to exercise, and how much is recommended.  Rather than frequent therapy visits, we agreed for him to return in three weeks to  check his progress on these two things.  He does not want to pursue a nighttime compression garment at the moment, but was advised to let us know if he does not achieve good management with daytime garments.    History and Personal Factors relevant to plan of care:  melanoma with two recurrences; lung cancer also diagnosed and treated; HTN    Clinical Presentation  Evolving    Clinical Presentation due to:  about to start radiation treatment for melanoma    Clinical Decision Making  Moderate    Rehab Potential  Good    PT Frequency  -- follow-up in 3 weeks, then again 1-2 times as needed    PT Treatment/Interventions  ADLs/Self Care Home Management;DME Instruction;Therapeutic exercise;Patient/family education;Orthotic Fit/Training;Manual techniques;Manual lymph drainage    PT Next Visit Plan  Follow-up in three weeks to see if he has gotten compression garments and if he has started his exercise program.    PT Home Exercise Plan  Exercise such as walking with a goal of 30 minutes 5 times a week.    Recommended Other Services  faxed prescription for flat knit compression sleeve and glove (up to 30-40 mmHg) for right UE    Consulted and Agree with Plan of Care  Patient       Patient will benefit from skilled therapeutic intervention in order to improve the following deficits and impairments:  Increased edema, Decreased knowledge of precautions, Decreased knowledge of use of DME  Visit Diagnosis: Lymphedema, not elsewhere classified - Plan: PT plan of care cert/re-cert  H/O Malignant melanoma - Plan: PT plan of care cert/re-cert     Problem List Patient Active Problem List   Diagnosis Date Noted  . 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 Genetic testing 07/04/2017  . Port catheter in place 06/12/2017  . Goals of care, counseling/discussion 04/25/2017  . Melanoma of skin ([place]) 04/25/2017    SALISBURY,DONNA 01/13/2018, 5:56 PM  [place], [name], [address] Phone: [phone]   Fax:  [phone]  Name: William Wilkerson MRN: [id number] Date of Birth: 1951-08-17  Serafina Royals, PT 01/13/18 5:56 PM  PHYSICAL THERAPY DISCHARGE SUMMARY  Visits from Start of Care: 1  Current functional level related to goals / functional outcomes: Unknown. The patient did not return for follow-up three weeks after this evaluation as planned.   Remaining deficits: Unknown.   Education / Equipment: About compression garments and starting an exercise program. Plan: Patient agrees to discharge.  Patient goals were not met. Patient is being discharged due to not returning since the last visit.  ?????    Serafina Royals, PT 04/23/18 3:59 PM

## 2018-01-13 NOTE — Patient Instructions (Signed)
[facility] [address], [place] [address]         [phone]  Why exercise?  So many benefits! Here are SOME of them: 1. Heart health, including raising your good cholesterol level and reducing heart rate and blood pressure 2. Lung health, including improved lung capacity 3. It burns fats, and most of [place] can stand to be leaner, whether or not we are overweight. 4. It increases the body's natural painkillers and mood elevators, so makes you feel better. 5. Not only makes you feel better, but look better too 6. Improves sleep 7. Takes a bite out of stress 8. May decrease your risk of many types of cancer 9. If you are currently undergoing cancer treatment, exercise may improve your ability to tolerate treatments including chemotherapy. 10. For everybody, it can improve your energy level. Those with cancer-related fatigue report a 40-50% reduction in this symptom when exercising regularly. 11. If you are a survivor of breast, colon, or prostate cancer, it may decrease your risk of a recurrence. (This may hold for other cancers too, but so far we have data just for these three types.)  How to exercise: 1. Get your doctor's okay. 2. Pick something you enjoy doing, like walking, Zumba, biking, swimming, or whatever. 3. Start at low intensity and time, then gradually increase.  (See walking program handout.) 4. Set a goal to achieve over time.  The American Cancer Society, American Heart Association, and U.S. Dept. of Health and Human Services recommend 150 minutes of moderate exercise, 75 minutes of vigorous exercise, or a combination of both per week. This should be done in episodes at least 10 minutes long, spread throughout the week.  Need help being motivated? 1. Pick something you enjoy doing, because you'll be more inclined to stick with that activity than something that feels like a chore. 2. Do it with a friend so that you are accountable to each  other. 3. Schedule it into your day. Place it on your calendar and keep that appointment just like you do any appointment that you make. 4. Join an exercise group that meets at a specific time.  That way, you have to show up on time, and that makes it harder to procrastinate about doing your workout.  It also keeps you accountable-people begin to expect you to be there. 5. Join a gym where you feel comfortable and not intimidated, at the right cost. 6. Sign up for something that you'll need to be in shape for on a specific date, like a 1K or a 5K to walk or run, a 20 or 30 mile bike ride, a mud run or something like that. If the date is looming, you know you'll need to train to be ready for it.  An added benefit is that many of these are fundraisers for good causes. 7. If you've already paid for a gym membership, group exercise class or event, you might as well work out, so you haven't wasted your money!

## 2018-01-16 ENCOUNTER — Inpatient Hospital Stay: Payer: Managed Care, Other (non HMO) | Attending: Oncology

## 2018-01-16 DIAGNOSIS — C439 Malignant melanoma of skin, unspecified: Secondary | ICD-10-CM | POA: Diagnosis not present

## 2018-01-16 DIAGNOSIS — Z95828 Presence of other vascular implants and grafts: Secondary | ICD-10-CM

## 2018-01-16 DIAGNOSIS — Z510 Encounter for antineoplastic radiation therapy: Secondary | ICD-10-CM | POA: Diagnosis not present

## 2018-01-16 MED ORDER — HEPARIN SOD (PORK) LOCK FLUSH 100 UNIT/ML IV SOLN
500.0000 [IU] | Freq: Once | INTRAVENOUS | Status: AC
  Administered 2018-01-16: 500 [IU]
  Filled 2018-01-16: qty 5

## 2018-01-16 MED ORDER — SODIUM CHLORIDE 0.9% FLUSH
10.0000 mL | Freq: Once | INTRAVENOUS | Status: AC
  Administered 2018-01-16: 10 mL
  Filled 2018-01-16: qty 10

## 2018-01-19 ENCOUNTER — Ambulatory Visit
Admission: RE | Admit: 2018-01-19 | Discharge: 2018-01-19 | Disposition: A | Discharge status: Home / Self Care | Payer: Managed Care, Other (non HMO) | Source: Ambulatory Visit | Attending: Radiation Oncology | Admitting: Radiation Oncology

## 2018-01-19 DIAGNOSIS — C761 Malignant neoplasm of thorax: Secondary | ICD-10-CM

## 2018-01-19 DIAGNOSIS — Z510 Encounter for antineoplastic radiation therapy: Secondary | ICD-10-CM | POA: Diagnosis not present

## 2018-01-19 MED ORDER — RADIAPLEXRX EX GEL
Freq: Once | CUTANEOUS | Status: AC
  Administered 2018-01-19: 17:00:00 via TOPICAL

## 2018-01-19 NOTE — Progress Notes (Signed)
Pt here for patient teaching.  Pt given Radiation and You booklet, skin care instructions and Radiaplex gel.  Reviewed areas of pertinence such as fatigue and skin changes . Pt able to give teach back of to pat skin, use unscented/gentle soap and drink plenty of water,apply Radiaplex bid and avoid applying anything to skin within 4 hours of treatment. Pt verbalizes understanding of information given and will contact nursing with any questions or concerns.     Http://rtanswers.org/treatmentinformation/whattoexpect/index

## 2018-01-20 ENCOUNTER — Ambulatory Visit
Admission: RE | Admit: 2018-01-20 | Discharge: 2018-01-20 | Disposition: A | Discharge status: Home / Self Care | Payer: Managed Care, Other (non HMO) | Source: Ambulatory Visit | Attending: Radiation Oncology | Admitting: Radiation Oncology

## 2018-01-20 DIAGNOSIS — Z510 Encounter for antineoplastic radiation therapy: Secondary | ICD-10-CM | POA: Diagnosis not present

## 2018-01-21 ENCOUNTER — Ambulatory Visit
Admission: RE | Admit: 2018-01-21 | Discharge: 2018-01-21 | Disposition: A | Discharge status: Home / Self Care | Payer: Managed Care, Other (non HMO) | Source: Ambulatory Visit | Attending: Radiation Oncology | Admitting: Radiation Oncology

## 2018-01-21 DIAGNOSIS — Z510 Encounter for antineoplastic radiation therapy: Secondary | ICD-10-CM | POA: Diagnosis not present

## 2018-01-22 ENCOUNTER — Ambulatory Visit
Admission: RE | Admit: 2018-01-22 | Discharge: 2018-01-22 | Disposition: A | Discharge status: Home / Self Care | Payer: Managed Care, Other (non HMO) | Source: Ambulatory Visit | Attending: Radiation Oncology | Admitting: Radiation Oncology

## 2018-01-22 DIAGNOSIS — Z510 Encounter for antineoplastic radiation therapy: Secondary | ICD-10-CM | POA: Diagnosis not present

## 2018-01-23 ENCOUNTER — Ambulatory Visit
Admission: RE | Admit: 2018-01-23 | Discharge: 2018-01-23 | Disposition: A | Discharge status: Home / Self Care | Payer: Managed Care, Other (non HMO) | Source: Ambulatory Visit | Attending: Radiation Oncology | Admitting: Radiation Oncology

## 2018-01-23 DIAGNOSIS — Z510 Encounter for antineoplastic radiation therapy: Secondary | ICD-10-CM | POA: Diagnosis not present

## 2018-01-26 ENCOUNTER — Ambulatory Visit
Admission: RE | Admit: 2018-01-26 | Discharge: 2018-01-26 | Disposition: A | Discharge status: Home / Self Care | Payer: Managed Care, Other (non HMO) | Source: Ambulatory Visit | Attending: Radiation Oncology | Admitting: Radiation Oncology

## 2018-01-26 DIAGNOSIS — C761 Malignant neoplasm of thorax: Secondary | ICD-10-CM

## 2018-01-26 DIAGNOSIS — Z510 Encounter for antineoplastic radiation therapy: Secondary | ICD-10-CM | POA: Diagnosis not present

## 2018-01-26 MED ORDER — ALRA NON-METALLIC DEODORANT (RAD-ONC)
1.0000 "application " | Freq: Once | TOPICAL | Status: AC
  Administered 2018-01-26: 1 via TOPICAL

## 2018-01-27 ENCOUNTER — Ambulatory Visit
Admission: RE | Admit: 2018-01-27 | Discharge: 2018-01-27 | Disposition: A | Discharge status: Home / Self Care | Payer: Managed Care, Other (non HMO) | Source: Ambulatory Visit | Attending: Radiation Oncology | Admitting: Radiation Oncology

## 2018-01-27 DIAGNOSIS — Z510 Encounter for antineoplastic radiation therapy: Secondary | ICD-10-CM | POA: Diagnosis not present

## 2018-01-28 ENCOUNTER — Ambulatory Visit
Admission: RE | Admit: 2018-01-28 | Discharge: 2018-01-28 | Disposition: A | Discharge status: Home / Self Care | Payer: Managed Care, Other (non HMO) | Source: Ambulatory Visit | Attending: Radiation Oncology | Admitting: Radiation Oncology

## 2018-01-28 DIAGNOSIS — Z510 Encounter for antineoplastic radiation therapy: Secondary | ICD-10-CM | POA: Diagnosis not present

## 2018-01-29 ENCOUNTER — Ambulatory Visit
Admission: RE | Admit: 2018-01-29 | Discharge: 2018-01-29 | Disposition: A | Discharge status: Home / Self Care | Payer: Managed Care, Other (non HMO) | Source: Ambulatory Visit | Attending: Radiation Oncology | Admitting: Radiation Oncology

## 2018-01-29 DIAGNOSIS — Z510 Encounter for antineoplastic radiation therapy: Secondary | ICD-10-CM | POA: Diagnosis not present

## 2018-01-30 ENCOUNTER — Ambulatory Visit
Admission: RE | Admit: 2018-01-30 | Discharge: 2018-01-30 | Disposition: A | Discharge status: Home / Self Care | Payer: Managed Care, Other (non HMO) | Source: Ambulatory Visit | Attending: Radiation Oncology | Admitting: Radiation Oncology

## 2018-01-30 DIAGNOSIS — Z510 Encounter for antineoplastic radiation therapy: Secondary | ICD-10-CM | POA: Diagnosis not present

## 2018-02-02 ENCOUNTER — Ambulatory Visit: Payer: Medicare Other | Admitting: Physical Therapy

## 2018-02-02 ENCOUNTER — Ambulatory Visit
Admission: RE | Admit: 2018-02-02 | Discharge: 2018-02-02 | Disposition: A | Discharge status: Home / Self Care | Payer: Managed Care, Other (non HMO) | Source: Ambulatory Visit | Attending: Radiation Oncology | Admitting: Radiation Oncology

## 2018-02-02 DIAGNOSIS — Z510 Encounter for antineoplastic radiation therapy: Secondary | ICD-10-CM | POA: Diagnosis not present

## 2018-02-03 ENCOUNTER — Ambulatory Visit
Admission: RE | Admit: 2018-02-03 | Discharge: 2018-02-03 | Disposition: A | Discharge status: Home / Self Care | Payer: Managed Care, Other (non HMO) | Source: Ambulatory Visit | Attending: Radiation Oncology | Admitting: Radiation Oncology

## 2018-02-03 DIAGNOSIS — Z510 Encounter for antineoplastic radiation therapy: Secondary | ICD-10-CM | POA: Diagnosis not present

## 2018-02-04 ENCOUNTER — Ambulatory Visit
Admission: RE | Admit: 2018-02-04 | Discharge: 2018-02-04 | Disposition: A | Discharge status: Home / Self Care | Payer: Managed Care, Other (non HMO) | Source: Ambulatory Visit | Attending: Radiation Oncology | Admitting: Radiation Oncology

## 2018-02-04 DIAGNOSIS — Z510 Encounter for antineoplastic radiation therapy: Secondary | ICD-10-CM | POA: Diagnosis not present

## 2018-02-05 ENCOUNTER — Ambulatory Visit
Admission: RE | Admit: 2018-02-05 | Discharge: 2018-02-05 | Disposition: A | Discharge status: Home / Self Care | Payer: Managed Care, Other (non HMO) | Source: Ambulatory Visit | Attending: Radiation Oncology | Admitting: Radiation Oncology

## 2018-02-05 DIAGNOSIS — Z510 Encounter for antineoplastic radiation therapy: Secondary | ICD-10-CM | POA: Diagnosis not present

## 2018-02-06 ENCOUNTER — Ambulatory Visit
Admission: RE | Admit: 2018-02-06 | Discharge: 2018-02-06 | Disposition: A | Discharge status: Home / Self Care | Payer: Managed Care, Other (non HMO) | Source: Ambulatory Visit | Attending: Radiation Oncology | Admitting: Radiation Oncology

## 2018-02-06 DIAGNOSIS — Z510 Encounter for antineoplastic radiation therapy: Secondary | ICD-10-CM | POA: Diagnosis not present

## 2018-02-09 ENCOUNTER — Ambulatory Visit
Admission: RE | Admit: 2018-02-09 | Discharge: 2018-02-09 | Disposition: A | Discharge status: Home / Self Care | Payer: Managed Care, Other (non HMO) | Source: Ambulatory Visit | Attending: Radiation Oncology | Admitting: Radiation Oncology

## 2018-02-09 DIAGNOSIS — Z510 Encounter for antineoplastic radiation therapy: Secondary | ICD-10-CM | POA: Diagnosis not present

## 2018-02-10 ENCOUNTER — Ambulatory Visit
Admission: RE | Admit: 2018-02-10 | Discharge: 2018-02-10 | Disposition: A | Discharge status: Home / Self Care | Payer: Managed Care, Other (non HMO) | Source: Ambulatory Visit | Attending: Radiation Oncology | Admitting: Radiation Oncology

## 2018-02-10 DIAGNOSIS — Z510 Encounter for antineoplastic radiation therapy: Secondary | ICD-10-CM | POA: Diagnosis not present

## 2018-02-11 ENCOUNTER — Ambulatory Visit
Admission: RE | Admit: 2018-02-11 | Discharge: 2018-02-11 | Disposition: A | Discharge status: Home / Self Care | Payer: Managed Care, Other (non HMO) | Source: Ambulatory Visit | Attending: Radiation Oncology | Admitting: Radiation Oncology

## 2018-02-11 DIAGNOSIS — Z510 Encounter for antineoplastic radiation therapy: Secondary | ICD-10-CM | POA: Diagnosis not present

## 2018-02-12 ENCOUNTER — Ambulatory Visit
Admission: RE | Admit: 2018-02-12 | Discharge: 2018-02-12 | Disposition: A | Discharge status: Home / Self Care | Payer: Managed Care, Other (non HMO) | Source: Ambulatory Visit | Attending: Radiation Oncology | Admitting: Radiation Oncology

## 2018-02-12 DIAGNOSIS — Z510 Encounter for antineoplastic radiation therapy: Secondary | ICD-10-CM | POA: Diagnosis not present

## 2018-02-13 ENCOUNTER — Inpatient Hospital Stay: Payer: Managed Care, Other (non HMO) | Attending: Oncology

## 2018-02-13 ENCOUNTER — Ambulatory Visit
Admission: RE | Admit: 2018-02-13 | Discharge: 2018-02-13 | Disposition: A | Discharge status: Home / Self Care | Payer: Managed Care, Other (non HMO) | Source: Ambulatory Visit | Attending: Radiation Oncology | Admitting: Radiation Oncology

## 2018-02-13 VITALS — BP 143/85 | HR 79 | Temp 97.9°F | Resp 20

## 2018-02-13 DIAGNOSIS — Z452 Encounter for adjustment and management of vascular access device: Secondary | ICD-10-CM | POA: Diagnosis present

## 2018-02-13 DIAGNOSIS — Z510 Encounter for antineoplastic radiation therapy: Secondary | ICD-10-CM | POA: Diagnosis not present

## 2018-02-13 DIAGNOSIS — C439 Malignant melanoma of skin, unspecified: Secondary | ICD-10-CM | POA: Insufficient documentation

## 2018-02-13 DIAGNOSIS — Z95828 Presence of other vascular implants and grafts: Secondary | ICD-10-CM

## 2018-02-13 MED ORDER — SODIUM CHLORIDE 0.9% FLUSH
10.0000 mL | Freq: Once | INTRAVENOUS | Status: AC
  Administered 2018-02-13: 10 mL
  Filled 2018-02-13: qty 10

## 2018-02-13 MED ORDER — HEPARIN SOD (PORK) LOCK FLUSH 100 UNIT/ML IV SOLN
500.0000 [IU] | Freq: Once | INTRAVENOUS | Status: AC
  Administered 2018-02-13: 500 [IU]
  Filled 2018-02-13: qty 5

## 2018-02-13 NOTE — Patient Instructions (Signed)
Implanted Port Home Guide An implanted port is a type of central line that is placed under the skin. Central lines are used to provide IV access when treatment or nutrition needs to be given through a person's veins. Implanted ports are used for long-term IV access. An implanted port may be placed because:  You need IV medicine that would be irritating to the small veins in your hands or arms.  You need long-term IV medicines, such as antibiotics.  You need IV nutrition for a long period.  You need frequent blood draws for lab tests.  You need dialysis.  Implanted ports are usually placed in the chest area, but they can also be placed in the upper arm, the abdomen, or the leg. An implanted port has two main parts:  Reservoir. The reservoir is round and will appear as a small, raised area under your skin. The reservoir is the part where a needle is inserted to give medicines or draw blood.  Catheter. The catheter is a thin, flexible tube that extends from the reservoir. The catheter is placed into a large vein. Medicine that is inserted into the reservoir goes into the catheter and then into the vein.  How will I care for my incision site? Do not get the incision site wet. Bathe or shower as directed by your health care provider. How is my port accessed? Special steps must be taken to access the port:  Before the port is accessed, a numbing cream can be placed on the skin. This helps numb the skin over the port site.  Your health care provider uses a sterile technique to access the port. ? Your health care provider must put on a mask and sterile gloves. ? The skin over your port is cleaned carefully with an antiseptic and allowed to dry. ? The port is gently pinched between sterile gloves, and a needle is inserted into the port.  Only "non-coring" port needles should be used to access the port. Once the port is accessed, a blood return should be checked. This helps ensure that the port  is in the vein and is not clogged.  If your port needs to remain accessed for a constant infusion, a clear (transparent) bandage will be placed over the needle site. The bandage and needle will need to be changed every week, or as directed by your health care provider.  Keep the bandage covering the needle clean and dry. Do not get it wet. Follow your health care provider's instructions on how to take a shower or bath while the port is accessed.  If your port does not need to stay accessed, no bandage is needed over the port.  What is flushing? Flushing helps keep the port from getting clogged. Follow your health care provider's instructions on how and when to flush the port. Ports are usually flushed with saline solution or a medicine called heparin. The need for flushing will depend on how the port is used.  If the port is used for intermittent medicines or blood draws, the port will need to be flushed: ? After medicines have been given. ? After blood has been drawn. ? As part of routine maintenance.  If a constant infusion is running, the port may not need to be flushed.  How long will my port stay implanted? The port can stay in for as long as your health care provider thinks it is needed. When it is time for the port to come out, surgery will be   done to remove it. The procedure is similar to the one performed when the port was put in. When should I seek immediate medical care? When you have an implanted port, you should seek immediate medical care if:  You notice a bad smell coming from the incision site.  You have swelling, redness, or drainage at the incision site.  You have more swelling or pain at the port site or the surrounding area.  You have a fever that is not controlled with medicine.  This information is not intended to replace advice given to you by your health care provider. Make sure you discuss any questions you have with your health care provider. Document  Released: 12/16/2005 Document Revised: 05/23/2016 Document Reviewed: 08/23/2013 Elsevier Interactive Patient Education  2017 Elsevier Inc.  

## 2018-02-17 ENCOUNTER — Encounter: Payer: Self-pay | Admitting: Radiation Oncology

## 2018-02-17 NOTE — Progress Notes (Signed)
  Radiation Oncology         [phone] ________________________________  Name: Denham Mose MRN: [id number]  Date: 02/17/2018  DOB: 1951-08-05  End of Treatment Note  Diagnosis:   Metastatic melanoma to the right axilla     Indication for treatment:  Aggressive locoregional control      Radiation treatment dates:   01/19/18 - 02/13/18  Site/dose:   Right axilla treated to 48 Gy with 20 fx of 2.4 Gy (SCV nodes were also included in the fields)  Beams/energy:   3D / 6X, 15X  Narrative: The patient tolerated radiation treatment relatively well.   Patient endorsed fatigue, redness at radiation site, and persistent post op swelling in right hand.  Plan: The patient has completed radiation treatment. The patient will return to radiation oncology clinic for routine followup in one month. I advised them to call or return sooner if they have any questions or concerns related to their recovery or treatment. PT referral has been made for lymphedema.  -----------------------------------  Eppie Gibson, MD  This document serves as a record of services personally performed by Eppie Gibson, MD. It was created on his behalf by Linward Natal, a trained medical scribe. The creation of this record is based on the scribe's personal observations and the provider's statements to them. This document has been checked and approved by the attending provider.

## 2018-03-06 ENCOUNTER — Encounter: Payer: Self-pay | Admitting: Radiation Oncology

## 2018-03-13 ENCOUNTER — Ambulatory Visit
Admission: RE | Admit: 2018-03-13 | Discharge: 2018-03-13 | Disposition: A | Discharge status: Home / Self Care | Payer: Managed Care, Other (non HMO) | Source: Ambulatory Visit | Attending: Radiation Oncology | Admitting: Radiation Oncology

## 2018-03-13 ENCOUNTER — Other Ambulatory Visit: Payer: Self-pay

## 2018-03-13 ENCOUNTER — Inpatient Hospital Stay: Payer: Managed Care, Other (non HMO) | Attending: Oncology

## 2018-03-13 ENCOUNTER — Encounter: Payer: Self-pay | Admitting: Radiation Oncology

## 2018-03-13 VITALS — BP 147/91 | HR 85 | Temp 98.8°F | Ht 68.0 in | Wt 188.4 lb

## 2018-03-13 DIAGNOSIS — M109 Gout, unspecified: Secondary | ICD-10-CM | POA: Diagnosis not present

## 2018-03-13 DIAGNOSIS — Z923 Personal history of irradiation: Secondary | ICD-10-CM | POA: Insufficient documentation

## 2018-03-13 DIAGNOSIS — C761 Malignant neoplasm of thorax: Secondary | ICD-10-CM | POA: Diagnosis present

## 2018-03-13 DIAGNOSIS — Z885 Allergy status to narcotic agent status: Secondary | ICD-10-CM | POA: Insufficient documentation

## 2018-03-13 DIAGNOSIS — L819 Disorder of pigmentation, unspecified: Secondary | ICD-10-CM | POA: Diagnosis not present

## 2018-03-13 DIAGNOSIS — C439 Malignant melanoma of skin, unspecified: Secondary | ICD-10-CM

## 2018-03-13 DIAGNOSIS — Z452 Encounter for adjustment and management of vascular access device: Secondary | ICD-10-CM | POA: Insufficient documentation

## 2018-03-13 DIAGNOSIS — C792 Secondary malignant neoplasm of skin: Secondary | ICD-10-CM | POA: Diagnosis not present

## 2018-03-13 DIAGNOSIS — Z95828 Presence of other vascular implants and grafts: Secondary | ICD-10-CM

## 2018-03-13 DIAGNOSIS — Z79899 Other long term (current) drug therapy: Secondary | ICD-10-CM | POA: Insufficient documentation

## 2018-03-13 HISTORY — DX: Personal history of irradiation: Z92.3

## 2018-03-13 MED ORDER — SODIUM CHLORIDE 0.9% FLUSH
10.0000 mL | Freq: Once | INTRAVENOUS | Status: AC
  Administered 2018-03-13: 10 mL
  Filled 2018-03-13: qty 10

## 2018-03-13 MED ORDER — SONAFINE EX EMUL
1.0000 "application " | Freq: Once | CUTANEOUS | Status: AC
  Administered 2018-03-13: 1 via TOPICAL

## 2018-03-13 MED ORDER — HEPARIN SOD (PORK) LOCK FLUSH 100 UNIT/ML IV SOLN
500.0000 [IU] | Freq: Once | INTRAVENOUS | Status: AC
  Administered 2018-03-13: 500 [IU]
  Filled 2018-03-13: qty 5

## 2018-03-13 NOTE — Progress Notes (Signed)
Radiation Oncology         [phone] ________________________________  Name: William Wilkerson MRN: [id number]  Date: 03/13/2018  DOB: 1951-10-02  Follow-Up Visit Note  Outpatient  CC: William Dales, MD  William Dales, MD  Diagnosis and Prior Radiotherapy:    ICD-10-CM   1. 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 He denies pain related to RT but reports pain to his bilateral thumbs related to gout. He does continue to report fatigue. He reports his right axilla has healed, but it did peel, and he continues to apply neosporin to this area. He also continues to apply Sonafine to the right axilla. He did see Dr. Clance Wilkerson on 03/06/2018. CT of the chest showed unchanged soft tissue nodules in the right axilla/chest wall. No  evidence of progressive  disease in the chest, abdomen, or pelvis. He reports decreased appetite. He does not eat much at one time and becomes full quickly.  He is working (truck driving).                         ALLERGIES:  is allergic to codeine; hydromorphone; morphine; and percocet [oxycodone-acetaminophen].  Meds: Current Outpatient Medications  Medication Sig Dispense Refill  . allopurinol (ZYLOPRIM) 100 MG tablet Take 100 mg by mouth daily.  3  . DULoxetine (CYMBALTA) 20 MG capsule Take 20 mg by mouth daily.    Marland Kitchen ibuprofen (ADVIL,MOTRIN) 800 MG tablet Take 800 mg by mouth every 8 (eight) hours as needed for mild pain.    . indomethacin (INDOCIN) 50 MG capsule Take 50 mg by mouth 3 (three) times daily as needed.    Marland Kitchen lisinopril (PRINIVIL,ZESTRIL) 40 MG tablet Take 40 mg  by mouth daily.  1  . metoprolol succinate (TOPROL-XL) 50 MG 24 hr tablet Take 50 mg by mouth daily. Take with or immediately following a meal.    . zolpidem (AMBIEN CR) 6.25 MG CR tablet Take 6.25 mg by mouth at bedtime as needed for sleep.    Marland Kitchen lidocaine-prilocaine (EMLA) cream Apply generous amount to portacath 1 hr prior to infusion.  DO  NOT RUB IN. (Patient not taking: Reported on 03/13/2018) 30 g 0   No current facility-administered medications for this encounter.     Physical Findings:  height is 5\' 8"  (1.727 m) and weight is 188 lb 6.4 oz (85.5 kg). His temperature is 98.8 F (37.1 C). His blood pressure is 147/91 (abnormal) and his pulse is 85. His oxygen saturation is 99%.   General: Alert and oriented, in no acute distress. Skin: He has a square-like patch over the right axilla which is resolving dry desquamation. Under this patch inferior to right axillary scar, he has a mass that is mobile and palpable and 1 cm in size, consistent with a lymph node. Skin intact. Hyperpigmentation along the right clavicle in the treatment fields. He has some darkness and dryness on his back in the treatment fields.   Lab Findings: Lab Results  Component Value Date   WBC 4.8 06/27/2017   HGB 12.5 (L) 06/27/2017  HCT 35.7 (L) 06/27/2017   MCV 88.3 06/27/2017   PLT 361 06/27/2017    Radiographic Findings: No results found.  Impression/Plan:  67 y.o. gentleman with metastatic melanoma to the right axilla, healing from radiotherapy.  . Advised patient to apply Vitamin E oil to the treatment field. He will continue routine follow-up at [facility].  Follow up with me prn. We discussed that if he develops shortness of breath or persistent dry cough it could be a rare case of radiation pneumonitis, and he knows to call or return if this occurs.   _____________________________________   Eppie Gibson, MD  This document serves as a record of services personally performed by Eppie Gibson, MD. It was  created on her behalf by Rae Lips, a trained medical scribe. The creation of this record is based on the scribe's personal observations and the provider's statements to them. This document has been checked and approved by the attending provider.

## 2018-03-13 NOTE — Progress Notes (Signed)
Mr. Leanos presents for follow up of radiation completed 02/13/18 to his Right Axilla. He denies pain reports pain to his bilateral thumbs related to gout. He does continue to report fatigue. His Right axilla has healed. It did peel and he does continue to apply neosporin to this area. He also continues to apply sonafine to the right axilla as well. He did see Dr. Clance Boll on 03/06/18. He reports decreased appetite. He does not eat much at one time and becomes full quickly.   BP (!) 147/91   Pulse 85   Temp 98.8 F (37.1 C)   Ht 5\' 8"  (1.727 m)   Wt 188 lb 6.4 oz (85.5 kg)   SpO2 99% Comment: room air  BMI 28.65 kg/m    Wt Readings from Last 3 Encounters:  03/13/18 188 lb 6.4 oz (85.5 kg)  01/06/18 200 lb 9.6 oz (91 kg)  06/27/17 215 lb 3.2 oz (97.6 kg)

## 2018-03-16 ENCOUNTER — Encounter: Payer: Self-pay | Admitting: Radiation Oncology

## 2018-04-23 ENCOUNTER — Telehealth: Payer: Self-pay

## 2018-04-23 NOTE — Telephone Encounter (Signed)
Spoke with patient concerning upcoming appointment. He has MyChart and declined calender and letter. Per 4/25 phone message return

## 2018-04-23 NOTE — Telephone Encounter (Signed)
Patient called concerning flush appointment need to verify with a provider before scheduling, and then I will return patient call. Per 4/25 phone message return.

## 2018-05-08 NOTE — Progress Notes (Signed)
  Radiation Oncology         [phone] ________________________________  Name: Markie Frith MRN: [id number]  Date: 01/12/2018  DOB: Apr 07, 1951  SIMULATION AND TREATMENT PLANNING NOTE  Outpatient  DIAGNOSIS:     ICD-10-CM   1. 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  Identity was confirmed.  All relevant records and images related to the planned course of therapy were reviewed.  The patient freely provided informed written consent to proceed with treatment after reviewing the details related to the planned course of therapy. The consent form was witnessed and verified by the simulation staff.    Then, the patient was set-up in a stable reproducible  supine position for radiation therapy.  CT images were obtained.  Surface markings were placed.  The CT images were loaded into the planning software.    TREATMENT PLANNING NOTE: Treatment planning then occurred.  The radiation prescription was entered and confirmed.    A total of 2 medically necessary complex treatment devices were fabricated and supervised by me, in the form of fields with MLCs to block esophagus, cord, and lungs. MORE FIELDS WITH MLCs MAY BE ADDED IN DOSIMETRY for dose homogeneity.  I have requested : 3D Simulation  I have requested a DVH of the following structures: esophagus, target, cord, lungs, brachial plexus.    The patient will receive 48 Gy in 20 fractions to the right axilla   -----------------------------------  Eppie Gibson, MD

## 2018-05-11 ENCOUNTER — Inpatient Hospital Stay: Payer: Managed Care, Other (non HMO) | Attending: Oncology

## 2018-05-11 VITALS — BP 155/88 | HR 65 | Temp 98.2°F | Resp 18

## 2018-05-11 DIAGNOSIS — C439 Malignant melanoma of skin, unspecified: Secondary | ICD-10-CM | POA: Insufficient documentation

## 2018-05-11 DIAGNOSIS — Z452 Encounter for adjustment and management of vascular access device: Secondary | ICD-10-CM | POA: Insufficient documentation

## 2018-05-11 DIAGNOSIS — Z95828 Presence of other vascular implants and grafts: Secondary | ICD-10-CM

## 2018-05-11 MED ORDER — SODIUM CHLORIDE 0.9% FLUSH
10.0000 mL | Freq: Once | INTRAVENOUS | Status: AC
  Administered 2018-05-11: 10 mL
  Filled 2018-05-11: qty 10

## 2018-05-11 MED ORDER — HEPARIN SOD (PORK) LOCK FLUSH 100 UNIT/ML IV SOLN
500.0000 [IU] | Freq: Once | INTRAVENOUS | Status: AC
  Administered 2018-05-11: 500 [IU]
  Filled 2018-05-11: qty 5

## 2018-05-11 NOTE — Patient Instructions (Signed)
Implanted Port Home Guide An implanted port is a type of central line that is placed under the skin. Central lines are used to provide IV access when treatment or nutrition needs to be given through a person's veins. Implanted ports are used for long-term IV access. An implanted port may be placed because:  You need IV medicine that would be irritating to the small veins in your hands or arms.  You need long-term IV medicines, such as antibiotics.  You need IV nutrition for a long period.  You need frequent blood draws for lab tests.  You need dialysis.  Implanted ports are usually placed in the chest area, but they can also be placed in the upper arm, the abdomen, or the leg. An implanted port has two main parts:  Reservoir. The reservoir is round and will appear as a small, raised area under your skin. The reservoir is the part where a needle is inserted to give medicines or draw blood.  Catheter. The catheter is a thin, flexible tube that extends from the reservoir. The catheter is placed into a large vein. Medicine that is inserted into the reservoir goes into the catheter and then into the vein.  How will I care for my incision site? Do not get the incision site wet. Bathe or shower as directed by your health care provider. How is my port accessed? Special steps must be taken to access the port:  Before the port is accessed, a numbing cream can be placed on the skin. This helps numb the skin over the port site.  Your health care provider uses a sterile technique to access the port. ? Your health care provider must put on a mask and sterile gloves. ? The skin over your port is cleaned carefully with an antiseptic and allowed to dry. ? The port is gently pinched between sterile gloves, and a needle is inserted into the port.  Only "non-coring" port needles should be used to access the port. Once the port is accessed, a blood return should be checked. This helps ensure that the port  is in the vein and is not clogged.  If your port needs to remain accessed for a constant infusion, a clear (transparent) bandage will be placed over the needle site. The bandage and needle will need to be changed every week, or as directed by your health care provider.  Keep the bandage covering the needle clean and dry. Do not get it wet. Follow your health care provider's instructions on how to take a shower or bath while the port is accessed.  If your port does not need to stay accessed, no bandage is needed over the port.  What is flushing? Flushing helps keep the port from getting clogged. Follow your health care provider's instructions on how and when to flush the port. Ports are usually flushed with saline solution or a medicine called heparin. The need for flushing will depend on how the port is used.  If the port is used for intermittent medicines or blood draws, the port will need to be flushed: ? After medicines have been given. ? After blood has been drawn. ? As part of routine maintenance.  If a constant infusion is running, the port may not need to be flushed.  How long will my port stay implanted? The port can stay in for as long as your health care provider thinks it is needed. When it is time for the port to come out, surgery will be   done to remove it. The procedure is similar to the one performed when the port was put in. When should I seek immediate medical care? When you have an implanted port, you should seek immediate medical care if:  You notice a bad smell coming from the incision site.  You have swelling, redness, or drainage at the incision site.  You have more swelling or pain at the port site or the surrounding area.  You have a fever that is not controlled with medicine.  This information is not intended to replace advice given to you by your health care provider. Make sure you discuss any questions you have with your health care provider. Document  Released: 12/16/2005 Document Revised: 05/23/2016 Document Reviewed: 08/23/2013 Elsevier Interactive Patient Education  2017 Elsevier Inc.  

## 2018-05-27 ENCOUNTER — Telehealth: Payer: Self-pay | Admitting: *Deleted

## 2018-05-27 ENCOUNTER — Other Ambulatory Visit: Payer: Self-pay | Admitting: Radiation Oncology

## 2018-05-27 DIAGNOSIS — C761 Malignant neoplasm of thorax: Secondary | ICD-10-CM

## 2018-05-27 NOTE — Telephone Encounter (Signed)
Called patient to inform of PT appt. for 06-03-18- arrival time - 2:15 pm @ [facility], spoke with patient and he is aware of this appt.

## 2018-06-03 ENCOUNTER — Encounter: Payer: Self-pay | Admitting: Physical Therapy

## 2018-06-03 ENCOUNTER — Other Ambulatory Visit: Payer: Self-pay

## 2018-06-03 ENCOUNTER — Ambulatory Visit: Payer: Managed Care, Other (non HMO) | Attending: Radiation Oncology | Admitting: Physical Therapy

## 2018-06-03 DIAGNOSIS — M25611 Stiffness of right shoulder, not elsewhere classified: Secondary | ICD-10-CM | POA: Insufficient documentation

## 2018-06-03 DIAGNOSIS — I890 Lymphedema, not elsewhere classified: Secondary | ICD-10-CM | POA: Diagnosis present

## 2018-06-03 DIAGNOSIS — G8929 Other chronic pain: Secondary | ICD-10-CM | POA: Insufficient documentation

## 2018-06-03 DIAGNOSIS — Z85820 Personal history of malignant melanoma of skin: Secondary | ICD-10-CM | POA: Diagnosis present

## 2018-06-03 DIAGNOSIS — M25511 Pain in right shoulder: Secondary | ICD-10-CM | POA: Insufficient documentation

## 2018-06-03 NOTE — Therapy (Signed)
[place], [name], [address] Phone: [phone]   Fax:  [phone]  Physical Therapy Evaluation  Patient Details  Name: William Wilkerson MRN: [id number] Date of Birth: April 23, 1951 Referring Provider: Dr. Isidore Moos   Encounter Date: 06/03/2018  PT End of Session - 06/03/18 1701    Visit Number  1    Number of Visits  13    Date for PT Re-Evaluation  07/01/18    PT Start Time  1435    PT Stop Time  1518    PT Time Calculation (min)  43 min    Activity Tolerance  Patient tolerated treatment well    Behavior During Therapy  [facility] for tasks assessed/performed       Past Medical History:  Diagnosis Date  . Cancer [facility]) 2008   prostate  . Genetic testing 07/04/2017   William Wilkerson underwent genetic counseling and testing for hereditary cancer syndromes on 06/12/2017. His results were negative for mutations in all 83 genes analyzed by [facility]'s 83-gene Multi-Cancers Panel. Genes analyzed include: ALK, APC, ATM, AXIN2, BAP1, BARD1, BLM, BMPR1A, BRCA1, BRCA2, BRIP1, CASR, CDC73, CDH1, CDK4, CDKN1B, CDKN1C, CDKN2A, CEBPA, CHEK2, CTNNA1, DICER1, DIS3L2, EGFR, EPCAM, FH  . History of radiation therapy 01/19/18- 02/13/18   Right axilla treated to 48 Gy with 20 fx of 2.4 Gy  . Hypertension   . Lung cancer ([place]) 07/2017  . Melanoma ([place]) 2013   x 4    Past Surgical History:  Procedure Laterality Date  . IR FLUORO GUIDE PORT INSERTION RIGHT  05/27/2017  . IR US GUIDE VASC ACCESS RIGHT  05/27/2017  . MELANOMA EXCISION Right    x4-abd & axillary  . PROSTATE SURGERY  2008   cancer  . ROTATOR CUFF REPAIR Left 1988  . THORACOSCOPY Left 08/25/2017   Thoracoscopy with Lobectomy Mediastinal and Regional Lymphadenectomy at [facility] by Dr. Marland Kitchen Haithcock    There were no vitals filed for this visit.   Subjective Assessment - 06/03/18 1439    Subjective  I had lung cancer and they put a tube in my lung after I had a lobeectomy and my  swelling went down but then the swelling came back after my radiation treatments. I had a sleeve but I stopped wearing it because it made it swell worse.     Pertinent History  History of metastatic melanoma diagnosed in 2013 with resection of that, located in right flank.  Cancer metastasized as seen by scans, so had right axillary node dissection in January 2017.  Another tumor was found at axilla so he had chemo last summer, finishing in June 2018 (six treatment every two weeks). He had lung cancer diagnosed and had left lower lobectomy in August 2018. Now he will have radiation to right axilla starting 01/19/18; will have 20 treatments. He said his right arm swelling drained when he had the lung cancer surgery drain in.  Pt got a sleeve and glove at [facility] and states it made his swelling worse.  HTN controlled with meds. h/o gout left foot and hand.    Patient Stated Goals  to get my arm smaller    Currently in Pain?  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 Deanne Coffer Dominance  Right    Prior Therapy  was seen here once earlier this year and obtained compression garments      Precautions   Precautions  Other (comment)    Precaution Comments  cancer precautions pt will follow up with oncologist later this month      Restrictions   Weight Bearing Restrictions  No      Balance Screen   Has the patient fallen in the past 6 months  No    Has the patient had a  decrease in activity level because of a fear of falling?   No    Is the patient reluctant to leave their home because of a fear of falling?   No      Home Environment   Living Environment  Private residence    Living Arrangements  Spouse/significant other    Available Help at Discharge  Family    Type of [place]  One level      Prior Function   Level of Independence  Independent    Vocation  Full time employment    Vocation Requirements  truck driver; not really heavy lifting but it can get strenuous sometimes--"you have to put your back into it sometimes"    Leisure  pt reports just working      Cognition   Overall Cognitive Status  Within Functional Limits for tasks assessed      ROM / Strength   AROM / PROM / Strength  AROM      AROM   AROM Assessment Site  Shoulder    Right/Left Shoulder  Left;Right    Right Shoulder Flexion  146 Degrees    Right Shoulder ABduction  125 Degrees    Left Shoulder Flexion  157 Degrees    Left Shoulder ABduction  135 Degrees pt had rotator cuff surgery on that shoulder in [place] - 06/03/18 1453      Type   Cancer Type  metastatic melanoma      Surgeries   Number Lymph Nodes Removed  28      Treatment   Active Chemotherapy Treatment  No    Past Chemotherapy Treatment  Yes    Active Radiation Treatment  No will start 01/19/18 with axilla targeted    Past Radiation Treatment  Yes    Date  02/13/18      What other symptoms do you have   Are you Having Heaviness or Tightness  Yes    Are you having Pain  Yes    Are you having pitting edema  Yes    Body Site  right arm    Is it Hard or Difficult finding clothes that fit  Yes    Do you have infections  No    [place] Sign  No      Lymphedema Assessments   Lymphedema  Assessments  Upper extremities      Right Upper Extremity Lymphedema   10 cm Proximal to Olecranon Process  38.3 cm    Olecranon Process  37 cm    15 cm  Proximal to Ulnar Styloid Process  35 cm    10 cm Proximal to Ulnar Styloid Process  30 cm    Just Proximal to Ulnar Styloid Process  21.6 cm    Across Hand at [employer]  24.6 cm    At [place] of 2nd Digit  8 cm      Left Upper Extremity Lymphedema   10 cm Proximal to Olecranon Process  33.5 cm    Olecranon Process  31.5 cm    10 cm Proximal to Ulnar Styloid Process  25 cm    Just Proximal to Ulnar Styloid Process  19 cm    Across Hand at [employer]  23.2 cm    At [place] of 2nd Digit  7.8 cm             Outpatient Re[facility]03/[facility]  Lymphedema Life Impact Scale Total Score  35.29 %      Objective measurements completed on examination: See above findings.              PT Education - 06/03/18 1700    Education provided  Yes    Education Details  importance of compression bandaging for maximal reduction, importance of compression garments both day and night time garments    Person(s) Educated  Patient    Methods  Explanation    Comprehension  Verbalized understanding          PT Long Term Goals - 06/03/18 1522      PT LONG TERM GOAL #1   Title  Pt will obtain a night time garment (Tribute wrap) for long term management of lymphedema    Time  4    Period  Weeks    Status  New    Target Date  07/01/18      PT LONG TERM GOAL #2   Title  Pt will be able to wear his custom flat knit sleeve and glove that he purchased in Jan 2019 for long term management of lymphedema.    Time  4    Period  Weeks    Status  New    Target Date  07/01/18      PT LONG TERM GOAL #3   Title  Pt will demonstrate a 4 cm decrease in circumference of right olecranon process to allow pt to fit into his compression garments    Baseline  37 cm    Time  4    Period  Weeks    Status  New    Target Date  07/01/18      PT LONG TERM GOAL #4   Title  Pt will be independent in a home exercise program for continued strengthening  and stretching for R shoulder    Time  4    Period  Weeks    Status  New    Target Date  07/01/18             Plan - 06/03/18 1512    Clinical Impression Statement  Pt is know to this clinic from previous treatments 2 years ago. He returned for an eval in Jan of this year for right UE lymphedema and was issued information to get measured for a compression  sleeve and glove at DME supplier. Pt did not return for follow up visits but did obtain his compression garments. He states the garments made his swelling worse and he has not been wearing them. He has a history of melanoma with recurrence and completed radiation to his right axilla in Feb 2019. Since that time he has had increased swelling in his right arm. His circumferential measurements are markedly increased since he was at this clinic in Jan 2019. He was briefly educated about the need for night time garments and pt is interested. Discussed with pt need for compression bandaging to reduce swelling in RUE to the point that he can wear the flat knit compression garments that he received earlier this year. Pt agreed but did state he has a hard time leaving the bandages in place because of his job. He would benefit from skilled PT services to decrease RUE lymphedema and assist pt with obtaining night time garments. He also presents with decreased ROM of RUE compared with left and reports shoulder pain. He is especially limited with abduction.     History and Personal Factors relevant to plan of care:  melanoma with 2 recurrences, lung cancer diangosed and treated, HTN, pt is right handed    Clinical Presentation  Evolving    Clinical Presentation due to:  awaiting scans at the end of the month to determine outcome of radiation    Clinical Decision Making  Moderate    Rehab Potential  Good    Clinical Impairments Affecting Rehab Potential  hx of radiation and recurrence of melanoma    PT Frequency  3x / week    PT Duration  4 weeks    PT  Treatment/Interventions  ADLs/Self Care Home Management;DME Instruction;Therapeutic exercise;Patient/family education;Orthotic Fit/Training;Manual techniques;Manual lymph drainage;Compression bandaging;Scar mobilization;Passive range of motion;Therapeutic activities;Vasopneumatic Device;Taping    PT Next Visit Plan  begin complete decongestive therapy including bandaging to RUE until reduced to the size he was in Jan 2019    Consulted and Agree with Plan of Care  Patient       Patient will benefit from skilled therapeutic intervention in order to improve the following deficits and impairments:  Increased edema, Decreased knowledge of precautions, Decreased knowledge of use of DME, Decreased strength, Decreased range of motion, Impaired UE functional use, Pain  Visit Diagnosis: Lymphedema, not elsewhere classified - Plan: PT plan of care cert/re-cert  Stiffness of right shoulder, not elsewhere classified - Plan: PT plan of care cert/re-cert  Chronic right shoulder pain - Plan: PT plan of care cert/re-cert     Problem List Patient Active Problem List   Diagnosis Date Noted  . 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 Genetic testing 07/04/2017  . Port catheter in place 06/12/2017  . Goals of care, counseling/discussion 04/25/2017  . Melanoma of skin ([place]) 04/25/2017    Allyson Sabal [facility] 06/03/2018, 5:03 PM  [place], [name], [address] Phone: [phone]   Fax:  [phone]  Name: William Wilkerson MRN: [id number] Date of Birth: 1951/05/15  Manus Gunning, PT 06/03/18 5:03 PM

## 2018-06-08 ENCOUNTER — Ambulatory Visit: Payer: Managed Care, Other (non HMO) | Admitting: Physical Therapy

## 2018-06-10 ENCOUNTER — Ambulatory Visit: Payer: Managed Care, Other (non HMO)

## 2018-06-10 DIAGNOSIS — M25611 Stiffness of right shoulder, not elsewhere classified: Secondary | ICD-10-CM

## 2018-06-10 DIAGNOSIS — M25511 Pain in right shoulder: Secondary | ICD-10-CM

## 2018-06-10 DIAGNOSIS — I890 Lymphedema, not elsewhere classified: Secondary | ICD-10-CM | POA: Diagnosis not present

## 2018-06-10 DIAGNOSIS — G8929 Other chronic pain: Secondary | ICD-10-CM

## 2018-06-10 NOTE — Therapy (Signed)
[place], [name], [address] Phone: [phone]   Fax:  [phone]  Physical Therapy Treatment  Patient Details  Name: William Wilkerson MRN: [id number] Date of Birth: 16-Jan-1951 Referring Provider: Dr. Isidore Moos   Encounter Date: 06/10/2018  PT End of Session - 06/10/18 1518    Visit Number  2    Number of Visits  13    Date for PT Re-Evaluation  07/01/18    PT Start Time  1436    PT Stop Time  1517    PT Time Calculation (min)  41 min    Activity Tolerance  Patient tolerated treatment well    Behavior During Therapy  William Wilkerson for tasks assessed/performed       Past Medical History:  Diagnosis Date  . Cancer William Wilk[facility]) 2008   prostate  . Genetic testing 07/04/2017   Mr. Neu underwent genetic counseling and testing for hereditary cancer syndromes on 06/12/2017. His results were negative for mutations in all 83 genes analyzed by [facility]'s 83-gene Multi-Cancers Panel. Genes analyzed include: ALK, APC, ATM, AXIN2, BAP1, BARD1, BLM, BMPR1A, BRCA1, BRCA2, BRIP1, CASR, CDC73, CDH1, CDK4, CDKN1B, CDKN1C, CDKN2A, CEBPA, CHEK2, CTNNA1, DICER1, DIS3L2, EGFR, EPCAM, FH  . History of radiation therapy 01/19/18- 02/13/18   Right axilla treated to 48 Gy with 20 fx of 2.4 Gy  . Hypertension   . Lung cancer ([place]) 07/2017  . Melanoma ([place]) 2013   x 4    Past Surgical History:  Procedure Laterality Date  . IR FLUORO GUIDE PORT INSERTION RIGHT  05/27/2017  . IR US GUIDE VASC ACCESS RIGHT  05/27/2017  . MELANOMA EXCISION Right    x4-abd & axillary  . PROSTATE SURGERY  2008   cancer  . ROTATOR CUFF REPAIR Left 1988  . THORACOSCOPY Left 08/25/2017   Thoracoscopy with Lobectomy Mediastinal and Regional Lymphadenectomy at [facility] by Dr. Marland Kitchen Haithcock    There were no vitals filed for this visit.  Subjective Assessment - 06/10/18 1441    Subjective  Nothing much different from when I was here for my evaluation last week.     Pertinent History  History of metastatic melanoma diagnosed in 2013 with resection of that, located in right flank.  Cancer metastasized as seen by scans, so had right axillary node dissection in January 2017.  Another tumor was found at axilla so he had chemo last summer, finishing in June 2018 (six treatment every two weeks). He had lung cancer diagnosed and had left lower lobectomy in August 2018. Now he will have radiation to right axilla starting 01/19/18; will have 20 treatments. He said his right arm swelling drained when he had the lung cancer surgery drain in.  Pt got a sleeve and glove at [facility] and states it made his swelling worse.  HTN controlled with meds. h/o gout left foot and hand.    Patient Stated Goals  to get my arm smaller    Currently in Pain?  Yes    Pain Score  7     Pain Location  Arm joints in arm    Pain Orientation  Right    Pain Descriptors / Indicators  Aching    Pain Type  Acute pain    Pain Onset  More than a month ago    Pain Frequency  Constant    Aggravating Factors   swelling makes it worse    Pain Relieving Factors  advil  Outpatient Rehab from [facility]03/[facility]  Lymphedema Life Impact Scale Total Score  35.29 %           [facility] Adult PT Treatment/Exercise - 06/10/18 0001      Manual Therapy   Manual Therapy  Manual Lymphatic Drainage (MLD);Compression Bandaging    Manual Lymphatic Drainage (MLD)  In Supine: Short neck, superficial and deep abdominals, Rt inguinal and Lt axilla nodes, Rt axillo-inguianl and anterior inter-axillary anastomsis then Rt UE from lateral upper arm to dorsal of hand working from proximal to distal then retracing all steps.    Compression Bandaging  Biotone lotion, thin stockinette, (tried no finger bandages today for trial), Artiflex, 1-6, 1-10 and 1-12 cm short stretch compression bandages from hand to axilla                  PT Long  Term Goals - 06/03/18 1522      PT LONG TERM GOAL #1   Title  Pt will obtain a night time garment (Tribute wrap) for long term management of lymphedema    Time  4    Period  Weeks    Status  New    Target Date  07/01/18      PT LONG TERM GOAL #2   Title  Pt will be able to wear his custom flat knit sleeve and glove that he purchased in Jan 2019 for long term management of lymphedema.    Time  4    Period  Weeks    Status  New    Target Date  07/01/18      PT LONG TERM GOAL #3   Title  Pt will demonstrate a 4 cm decrease in circumference of right olecranon process to allow pt to fit into his compression garments    Baseline  37 cm    Time  4    Period  Weeks    Status  New    Target Date  07/01/18      PT LONG TERM GOAL #4   Title  Pt will be independent in a home exercise program for continued strengthening and stretching for R shoulder    Time  4    Period  Weeks    Status  New    Target Date  07/01/18            Plan - 06/10/18 1518    Clinical Impression Statement  First session of CDT today. 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 Trial of not wrapping his fingers as he reports this will allow him better use of his UE with his job duties and his fingers are not swollen at this time. Did instruct pt that this was just a trial and if his fingers started to swell we would have to include them. He verbalized understanding this.     Rehab Potential  Good    Clinical Impairments Affecting Rehab Potential  hx of radiation and recurrence of melanoma    PT Frequency  3x / week    PT Treatment/Interventions  ADLs/Self Care Home Management;DME Instruction;Therapeutic exercise;Patient/family education;Orthotic Fit/Training;Manual techniques;Manual lymph drainage;Compression bandaging;Scar mobilization;Passive range of motion;Therapeutic  activities;Vasopneumatic Device;Taping    PT Next Visit Plan  Cont complete decongestive therapy including bandaging to RUE until reduced to the size he was in Jan 2019; pt  to bring sleeve/glove for [place] to see if this will still be beneficial when he reduces; may need fourth bandage with herring bone pattern for forearm swelling.     Consulted and Agree with Plan of Care  Patient       Patient will benefit from skilled therapeutic intervention in order to improve the following deficits and impairments:  Increased edema, Decreased knowledge of precautions, Decreased knowledge of use of DME, Decreased strength, Decreased range of motion, Impaired UE functional use, Pain  Visit Diagnosis: Lymphedema, not elsewhere classified  Stiffness of right shoulder, not elsewhere classified  Chronic right shoulder pain     Problem List Patient Active Problem List   Diagnosis Date Noted  . 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 Genetic testing 07/04/2017  . Port catheter in place 06/12/2017  . Goals of care, counseling/discussion 04/25/2017  . Melanoma of skin ([place]) 04/25/2017    Otelia Limes, PTA 06/10/2018, 3:22 PM  [place], [name], [address] Phone: [phone]   Fax:  [phone]  Name: William Wilkerson MRN: [id number] Date of Birth: March 13, 1951

## 2018-06-11 ENCOUNTER — Encounter: Payer: Self-pay | Admitting: Rehabilitation

## 2018-06-11 ENCOUNTER — Ambulatory Visit: Payer: Managed Care, Other (non HMO) | Admitting: Rehabilitation

## 2018-06-11 DIAGNOSIS — M25511 Pain in right shoulder: Secondary | ICD-10-CM

## 2018-06-11 DIAGNOSIS — I890 Lymphedema, not elsewhere classified: Secondary | ICD-10-CM | POA: Diagnosis not present

## 2018-06-11 DIAGNOSIS — G8929 Other chronic pain: Secondary | ICD-10-CM

## 2018-06-11 DIAGNOSIS — Z85820 Personal history of malignant melanoma of skin: Secondary | ICD-10-CM

## 2018-06-11 NOTE — Therapy (Signed)
[place], [name], [address] Phone: [phone]   Fax:  [phone]  Physical Therapy Treatment  Patient Details  Name: William Wilkerson MRN: [id number] Date of Birth: [birth year] Referring Provider: Dr. Isidore Moos   Encounter Date: 06/11/2018  PT End of Session - 06/11/18 1652    Visit Number  3    Number of Visits  13    Date for PT Re-Evaluation  07/01/18    PT Start Time  1608    PT Stop Time  1645    PT Time Calculation (min)  37 min    Activity Tolerance  Patient tolerated treatment well    Behavior During Therapy  [facility] for tasks assessed/performed       Past Medical History:  Diagnosis Date  . Cancer [facility]) 2008   prostate  . Genetic testing 07/04/2017   William Wilkerson underwent genetic counseling and testing for hereditary cancer syndromes on 06/12/2017. His results were negative for mutations in all 83 genes analyzed by [facility]'s 83-gene Multi-Cancers Panel. Genes analyzed include: ALK, APC, ATM, AXIN2, BAP1, BARD1, BLM, BMPR1A, BRCA1, BRCA2, BRIP1, CASR, CDC73, CDH1, CDK4, CDKN1B, CDKN1C, CDKN2A, CEBPA, CHEK2, CTNNA1, DICER1, DIS3L2, EGFR, EPCAM, FH  . History of radiation therapy 01/19/18- 02/13/18   Right axilla treated to 48 Gy with 20 fx of 2.4 Gy  . Hypertension   . Lung cancer ([place]) 07/2017  . Melanoma ([place]) 2013   x 4    Past Surgical History:  Procedure Laterality Date  . IR FLUORO GUIDE PORT INSERTION RIGHT  05/27/2017  . IR US GUIDE VASC ACCESS RIGHT  05/27/2017  . MELANOMA EXCISION Right    x4-abd & axillary  . PROSTATE SURGERY  2008   cancer  . ROTATOR CUFF REPAIR Left 1988  . THORACOSCOPY Left 08/25/2017   Thoracoscopy with Lobectomy Mediastinal and Regional Lymphadenectomy at [facility] by Dr. Marland Kitchen Wilkerson    There were no vitals filed for this visit.  Subjective Assessment - 06/11/18 1612    Subjective  No increased swelling without the fingers wrapped.  "The hand went down"     Pertinent History  History of metastatic melanoma diagnosed in 2013 with resection of that, located in right flank.  Cancer metastasized as seen by scans, so had right axillary node dissection in January 2017.  Another tumor was found at axilla so he had chemo last summer, finishing in June 2018 (six treatment every two weeks). He had lung cancer diagnosed and had left lower lobectomy in August 2018. Now he will have radiation to right axilla starting 01/19/18; will have 20 treatments. He said his right arm swelling drained when he had the lung cancer surgery drain in.  Pt got a sleeve and glove at [place] and states it made his swelling worse.  HTN controlled with meds. h/o gout left foot and hand.    Currently in Pain?  Yes    Pain Score  6     Pain Location  -- R thumb, elbow, finger     Pain Orientation  Right    Pain Descriptors / Indicators  Aching    Pain Type  Acute pain                  Outpatient Re[facility]  Lymphedema Life Impact Scale Total Score  35.29 %           [facility] Adult PT Treatment/Exercise - 06/11/18 0001  Manual Therapy   Manual Therapy  Manual Lymphatic Drainage (MLD);Compression Bandaging;Edema management    Edema Management  mondi esprit class 2 glove and sleeve     Manual Lymphatic Drainage (MLD)  In Supine: Short neck, superficial and deep abdominals, Rt inguinal and Lt axilla nodes, Rt axillo-inguianl and anterior inter-axillary anastomsis then Rt UE from lateral upper arm to dorsal of hand working from proximal to distal then retracing all steps..  Lt sidelying posterior shoulder into inter axillary anastamoses     Compression Bandaging  Biotone lotion, thin stockinette, (tried no finger bandages today for trial), Artiflex, 1-6, 1-10 and 1-12 cm short stretch compression bandages from hand to axilla                  PT Long Term Goals - 06/03/18 1522      PT LONG TERM  GOAL #1   Title  Pt will obtain a night time garment (Tribute wrap) for long term management of lymphedema    Time  4    Period  Weeks    Status  New    Target Date  07/01/18      PT LONG TERM GOAL #2   Title  Pt will be able to wear his custom flat knit sleeve and glove that he purchased in Jan 2019 for long term management of lymphedema.    Time  4    Period  Weeks    Status  New    Target Date  07/01/18      PT LONG TERM GOAL #3   Title  Pt will demonstrate a 4 cm decrease in circumference of right olecranon process to allow pt to fit into his compression garments    Baseline  37 cm    Time  4    Period  Weeks    Status  New    Target Date  07/01/18      PT LONG TERM GOAL #4   Title  Pt will be independent in a home exercise program for continued strengthening and stretching for R shoulder    Time  4    Period  Weeks    Status  New    Target Date  07/01/18            Plan - 06/11/18 1652    Clinical Impression Statement  Trial of not wrapping fingers did not increase swelling per pt.  Did have some swelling present as felt during MLD.  Did not wrap them again for pt compliance.  Focus on forearm today.  Used only 3 bandages today but did herringbone on forearm    Clinical Impairments Affecting Rehab Potential  hx of radiation and recurrence of melanoma    PT Frequency  3x / week    PT Duration  4 weeks    PT Treatment/Interventions  ADLs/Self Care Home Management;DME Instruction;Therapeutic exercise;Patient/family education;Orthotic Fit/Training;Manual techniques;Manual lymph drainage;Compression bandaging;Scar mobilization;Passive range of motion;Therapeutic activities;Vasopneumatic Device;Taping    PT Next Visit Plan  Issue remedial exercises. Cont complete decongestive therapy including bandaging to RUE until reduced to the size he was in Jan 2019;; may need fourth bandage with herring bone pattern for forearm swelling.        Patient will benefit from skilled  therapeutic intervention in order to improve the following deficits and impairments:  Increased edema, Decreased knowledge of precautions, Decreased knowledge of use of DME, Decreased strength, Decreased range of motion, Impaired UE functional use, Pain  Visit Diagnosis: Lymphedema,  not elsewhere classified  Chronic right shoulder pain  H/O Malignant melanoma     Problem List Patient Active Problem List   Diagnosis Date Noted  . 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 Genetic testing 07/04/2017  . Port catheter in place 06/12/2017  . Goals of care, counseling/discussion 04/25/2017  . Melanoma of skin ([place]) 04/25/2017    Shan Levans, PT 06/11/2018, 4:55 PM  [place], [name], [address] Phone: [phone]   Fax:  [phone]  Name: William Wilkerson MRN: [id number] Date of Birth: 30-Jul-1951

## 2018-06-17 ENCOUNTER — Ambulatory Visit: Payer: Managed Care, Other (non HMO)

## 2018-06-17 DIAGNOSIS — I890 Lymphedema, not elsewhere classified: Secondary | ICD-10-CM | POA: Diagnosis not present

## 2018-06-17 DIAGNOSIS — M25511 Pain in right shoulder: Secondary | ICD-10-CM

## 2018-06-17 DIAGNOSIS — G8929 Other chronic pain: Secondary | ICD-10-CM

## 2018-06-17 DIAGNOSIS — Z85820 Personal history of malignant melanoma of skin: Secondary | ICD-10-CM

## 2018-06-17 NOTE — Therapy (Signed)
[place], [name], [address] Phone: [phone]   Fax:  [phone]  Physical Therapy Treatment  Patient Details  Name: William Wilkerson MRN: [id number] Date of Birth: 13-Jul-1951 Referring Provider: Dr. Isidore Moos   Encounter Date: 06/17/2018  PT End of Session - 06/17/18 1623    Visit Number  4    Number of Visits  13    Date for PT Re-Evaluation  07/01/18    PT Start Time  1522    PT Stop Time  1616    PT Time Calculation (min)  54 min    Activity Tolerance  Patient tolerated treatment well    Behavior During Therapy  [facility] for tasks assessed/performed       Past Medical History:  Diagnosis Date  . Cancer [facility]) 2008   prostate  . Genetic testing 07/04/2017   Mr. Bojanowski underwent genetic counseling and testing for hereditary cancer syndromes on 06/12/2017. His results were negative for mutations in all 83 genes analyzed by [facility]'s 83-gene Multi-Cancers Panel. Genes analyzed include: ALK, APC, ATM, AXIN2, BAP1, BARD1, BLM, BMPR1A, BRCA1, BRCA2, BRIP1, CASR, CDC73, CDH1, CDK4, CDKN1B, CDKN1C, CDKN2A, CEBPA, CHEK2, CTNNA1, DICER1, DIS3L2, EGFR, EPCAM, FH  . History of radiation therapy 01/19/18- 02/13/18   Right axilla treated to 48 Gy with 20 fx of 2.4 Gy  . Hypertension   . Lung cancer ([place]) 07/2017  . Melanoma ([place]) 2013   x 4    Past Surgical History:  Procedure Laterality Date  . IR FLUORO GUIDE PORT INSERTION RIGHT  05/27/2017  . IR US GUIDE VASC ACCESS RIGHT  05/27/2017  . MELANOMA EXCISION Right    x4-abd & axillary  . PROSTATE SURGERY  2008   cancer  . ROTATOR CUFF REPAIR Left 1988  . THORACOSCOPY Left 08/25/2017   Thoracoscopy with Lobectomy Mediastinal and [facility]aden[facility] at [facility] by Dr. Marland Kitchen [place]    There were no vitals filed for this visit.  Subjective Assessment - 06/17/18 1530    Subjective  Bandages slid off that same night after last visit but my arm looked good that  night.     Pertinent History  History of metastatic melanoma diagnosed in 2013 with resection of that, located in right flank.  Cancer metastasized as seen by scans, so had right axillary node dissection in January 2017.  Another tumor was found at axilla so he had chemo last summer, finishing in June 2018 (six treatment every two weeks). He had lung cancer diagnosed and had left lower lobectomy in August 2018. Now he will have radiation to right axilla starting 01/19/18; will have 20 treatments. He said his right arm swelling drained when he had the lung cancer surgery drain in.  Pt got a sleeve and glove at [place] and states it made his swelling worse.  HTN controlled with meds. h/o gout left foot and hand.    Patient Stated Goals  to get my arm smaller    Currently in Pain?  Yes    Pain Score  5     Pain Location  Arm Rt thumb, elbow, shoulder    Pain Orientation  Right    Pain Descriptors / Indicators  Aching    Pain Type  Acute pain    Pain Onset  More than a month ago    Pain Frequency  Constant    Aggravating Factors   swelling makes it worse    Pain Relieving Factors  advil  Outpatient Rehab from [facility]/03/2018 [facility]  Lymphedema Life Impact Scale Total Score  35.29 %           [facility] Adult PT Treatment/Exercise - 06/17/18 0001      Manual Therapy   Manual Lymphatic Drainage (MLD)  In Supine: Short neck, superficial and deep abdominals, Rt inguinal and Lt axilla nodes, Rt axillo-inguianl and anterior inter-axillary anastomsis then Rt UE from lateral upper arm to dorsal of hand working from proximal to distal then retracing all steps..    Compression Bandaging  Biotone lotion, thin stockinette, (tried no finger bandages today for trial), Artiflex, 1-6, 1-10 and 1-12 cm short stretch compression bandages from hand to axilla                  PT Long Term Goals - 06/03/18 1522      PT LONG TERM GOAL  #1   Title  Pt will obtain a night time garment (Tribute wrap) for long term management of lymphedema    Time  4    Period  Weeks    Status  New    Target Date  07/01/18      PT LONG TERM GOAL #2   Title  Pt will be able to wear his custom flat knit sleeve and glove that he purchased in Jan 2019 for long term management of lymphedema.    Time  4    Period  Weeks    Status  New    Target Date  07/01/18      PT LONG TERM GOAL #3   Title  Pt will demonstrate a 4 cm decrease in circumference of right olecranon process to allow pt to fit into his compression garments    Baseline  37 cm    Time  4    Period  Weeks    Status  New    Target Date  07/01/18      PT LONG TERM GOAL #4   Title  Pt will be independent in a home exercise program for continued strengthening and stretching for R shoulder    Time  4    Period  Weeks    Status  New    Target Date  07/01/18            Plan - 06/17/18 1626    Clinical Impression Statement  Pt reports bandages slid off his arm same night of last appt and reported his arm had come down aot. Pt is still unwilling to learn to self bandage but did report he will be able to start 3x/wk next week so with him hopefully being more consistent with Pt we'll start being able to see more consistent circumferential reductions. Wrapped bandages slightly tighter than usual to see if this would help them stay on longer. Pt reported them comfortable upon finishing and was instructed to leave them on until next visit tomorrow if able and he agreed.     Rehab Potential  Good    Clinical Impairments Affecting Rehab Potential  hx of radiation and recurrence of melanoma    PT Frequency  3x / week    PT Duration  4 weeks    PT Treatment/Interventions  ADLs/Self Care Home Management;DME Instruction;Therapeutic exercise;Patient/family education;Orthotic Fit/Training;Manual techniques;Manual lymph drainage;Compression bandaging;Scar mobilization;Passive range of  motion;Therapeutic activities;Vasopneumatic Device;Taping    PT Next Visit Plan  Issue remedial exercises. Cont complete decongestive therapy including bandaging to Rt UE until reduced to the size he  was in Jan 2019; may need fourth bandage with herring bone pattern for forearm swelling (10 cm from wrist to forearm then 10 cm "X" and 12 cm.Marland KitchenMarland Kitchen??)     Consulted and Agree with Plan of Care  Patient       Patient will benefit from skilled therapeutic intervention in order to improve the following deficits and impairments:  Increased edema, Decreased knowledge of precautions, Decreased knowledge of use of DME, Decreased strength, Decreased range of motion, Impaired UE functional use, Pain  Visit Diagnosis: Lymphedema, not elsewhere classified  Chronic right shoulder pain  H/O Malignant melanoma     Problem List Patient Active Problem List   Diagnosis Date Noted  . 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 Genetic testing 07/04/2017  . Port catheter in place 06/12/2017  . Goals of care, counseling/discussion 04/25/2017  . Melanoma of skin ([place]) 04/25/2017    Golda Acre 06/17/2018, 4:41 PM  [place], [name], [address] Phone: [phone]   Fax:  [phone]  Name: Jill Ruppe MRN: [id number] Date of Birth: 1951/03/18

## 2018-06-18 ENCOUNTER — Encounter: Payer: Self-pay | Admitting: Rehabilitation

## 2018-06-18 ENCOUNTER — Ambulatory Visit: Payer: Managed Care, Other (non HMO) | Admitting: Rehabilitation

## 2018-06-18 DIAGNOSIS — G8929 Other chronic pain: Secondary | ICD-10-CM

## 2018-06-18 DIAGNOSIS — Z85820 Personal history of malignant melanoma of skin: Secondary | ICD-10-CM

## 2018-06-18 DIAGNOSIS — I890 Lymphedema, not elsewhere classified: Secondary | ICD-10-CM | POA: Diagnosis not present

## 2018-06-18 DIAGNOSIS — M25511 Pain in right shoulder: Secondary | ICD-10-CM

## 2018-06-18 DIAGNOSIS — M25611 Stiffness of right shoulder, not elsewhere classified: Secondary | ICD-10-CM

## 2018-06-18 NOTE — Therapy (Signed)
[place], [name], [address] Phone: [phone]   Fax:  [phone]  Physical Therapy Treatment  Patient Details  Name: William Wilkerson MRN: [id number] Date of Birth: 1951-08-15 Referring Provider: Dr. Isidore Moos   Encounter Date: 06/18/2018  PT End of Session - 06/18/18 1701    Visit Number  5    Number of Visits  13    Date for PT Re-Evaluation  07/01/18    PT Start Time  1609    PT Stop Time  1650    PT Time Calculation (min)  41 min    Activity Tolerance  Patient tolerated treatment well    Behavior During Therapy  [facility] for tasks assessed/performed       Past Medical History:  Diagnosis Date  . Cancer [facility]) 2008   prostate  . Genetic testing 07/04/2017   William Wilkerson underwent genetic counseling and testing for hereditary cancer syndromes on 06/12/2017. His results were negative for mutations in all 83 genes analyzed by [facility]'s 83-gene Multi-Cancers Panel. Genes analyzed include: ALK, APC, ATM, AXIN2, BAP1, BARD1, BLM, BMPR1A, BRCA1, BRCA2, BRIP1, CASR, CDC73, CDH1, CDK4, CDKN1B, CDKN1C, CDKN2A, CEBPA, CHEK2, CTNNA1, DICER1, DIS3L2, EGFR, EPCAM, FH  . History of radiation therapy 01/19/18- 02/13/18   Right axilla treated to 48 Gy with 20 fx of 2.4 Gy  . Hypertension   . Lung cancer ([place]) 07/2017  . Melanoma ([place]) 2013   x 4    Past Surgical History:  Procedure Laterality Date  . IR FLUORO GUIDE PORT INSERTION RIGHT  05/27/2017  . IR US GUIDE VASC ACCESS RIGHT  05/27/2017  . MELANOMA EXCISION Right    x4-abd & axillary  . PROSTATE SURGERY  2008   cancer  . ROTATOR CUFF REPAIR Left 1988  . THORACOSCOPY Left 08/25/2017   Thoracoscopy with Lobectomy Mediastinal and Regional Lymphadenectomy at [facility] by Dr. Marland Kitchen Wilkerson    There were no vitals filed for this visit.  Subjective Assessment - 06/18/18 1613    Subjective  just took the bandages off at 2:30 Still doing well without the fingers wrapped.   The extra tight bandages were fine    Pertinent History  History of metastatic melanoma diagnosed in 2013 with resection of that, located in right flank.  Cancer metastasized as seen by scans, so had right axillary node dissection in January 2017.  Another tumor was found at axilla so he had chemo last summer, finishing in June 2018 (six treatment every two weeks). He had lung cancer diagnosed and had left lower lobectomy in August 2018. Now he will have radiation to right axilla starting 01/19/18; will have 20 treatments. He said his right arm swelling drained when he had the lung cancer surgery drain in.  Pt got a sleeve and glove at [place] and states it made his swelling worse.  HTN controlled with meds. h/o gout left foot and hand.    Patient Stated Goals  to get my arm smaller    Currently in Pain?  Yes    Pain Score  8     Pain Location  Shoulder    Pain Orientation  Right    Pain Descriptors / Indicators  Aching    Pain Type  Chronic pain    Pain Radiating Towards  down into shoulder     Pain Onset  More than a month ago    Pain Frequency  Constant    Aggravating Factors   movment out  to the side     Pain Relieving Factors  advil                   Outpatient Re[facility] from [facility]03/[facility]  Lymphedema Life Impact Scale Total Score  35.29 %           [facility] Adult PT Treatment/Exercise - 06/18/18 0001      Manual Therapy   Manual Lymphatic Drainage (MLD)  In Supine: Short neck, superficial and deep abdominals, Rt inguinal and Lt axilla nodes, Rt axillo-inguianl and anterior inter-axillary anastomsis then Rt UE from lateral upper arm to dorsal of hand working from proximal to distal then retracing all steps..    Compression Bandaging  Biotone lotion, thin stockinette, Artiflex, 1-6, 1-10 and 1-12 cm short stretch compression bandages from hand to axilla, 2nd bandage in herringbone pattern                  PT Long  Term Goals - 06/18/18 1703      PT LONG TERM GOAL #1   Title  Pt will obtain a night time garment (Tribute wrap) for long term management of lymphedema    Status  On-going      PT LONG TERM GOAL #2   Title  Pt will be able to wear his custom flat knit sleeve and glove that he purchased in Jan 2019 for long term management of lymphedema.    Status  On-going      PT LONG TERM GOAL #3   Title  Pt will demonstrate a 4 cm decrease in circumference of right olecranon process to allow pt to fit into his compression garments    Status  On-going      PT LONG TERM GOAL #4   Title  Pt will be independent in a home exercise program for continued strengthening and stretching for R shoulder    Status  On-going            Plan - 06/18/18 1702    Clinical Impression Statement  Tolerated all well today.  may need to add more trunk/lateral wall MLD due to reports of arm going down with chest wall draining in eval.  Still tolerating bandages well.  Upper arm and forearm softening with MLD    Rehab Potential  Good    Clinical Impairments Affecting Rehab Potential  hx of radiation and recurrence of melanoma    PT Frequency  3x / week    PT Duration  4 weeks    PT Treatment/Interventions  ADLs/Self Care Home Management;DME Instruction;Therapeutic exercise;Patient/family education;Orthotic Fit/Training;Manual techniques;Manual lymph drainage;Compression bandaging;Scar mobilization;Passive range of motion;Therapeutic activities;Vasopneumatic Device;Taping    PT Next Visit Plan  Issue remedial exercises. Cont complete decongestive therapy including bandaging to Rt UE until reduced to the size he was in Jan 2019;        Patient will benefit from skilled therapeutic intervention in order to improve the following deficits and impairments:  Increased edema, Decreased knowledge of precautions, Decreased knowledge of use of DME, Decreased strength, Decreased range of motion, Impaired UE functional use,  Pain  Visit Diagnosis: Lymphedema, not elsewhere classified  Chronic right shoulder pain  H/O Malignant melanoma  Stiffness of right shoulder, not elsewhere classified     Problem List Patient Active Problem List   Diagnosis Date Noted  . 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 Genetic testing 07/04/2017  . Port catheter in place 06/12/2017  . Goals of care, counseling/discussion  04/25/2017  . Melanoma of skin ([place]) 04/25/2017    Shan Levans, PT 06/18/2018, 5:05 PM  [place], [name], [address] Phone: [phone]   Fax:  [phone]  Name: William Wilkerson MRN: [id number] Date of Birth: [birth year]

## 2018-06-19 ENCOUNTER — Encounter

## 2018-06-22 ENCOUNTER — Ambulatory Visit: Payer: Managed Care, Other (non HMO) | Admitting: Physical Therapy

## 2018-06-22 DIAGNOSIS — M25511 Pain in right shoulder: Secondary | ICD-10-CM

## 2018-06-22 DIAGNOSIS — I890 Lymphedema, not elsewhere classified: Secondary | ICD-10-CM | POA: Diagnosis not present

## 2018-06-22 DIAGNOSIS — G8929 Other chronic pain: Secondary | ICD-10-CM

## 2018-06-22 NOTE — Therapy (Signed)
[place], [name], [address] Phone: [phone]   Fax:  [phone]  Physical Therapy Treatment  Patient Details  Name: William Wilkerson MRN: [id number] Date of Birth: January 30, 1951 Referring Provider: Dr. Isidore Moos   Encounter Date: 06/22/2018  PT End of Session - 06/22/18 1656    Visit Number  6    Number of Visits  13    Date for PT Re-Evaluation  07/01/18    PT Start Time  6144    PT Stop Time  1645    PT Time Calculation (min)  38 min    Activity Tolerance  Patient tolerated treatment well    Behavior During Therapy  [facility] for tasks assessed/performed       Past Medical History:  Diagnosis Date  . Cancer [facility]) 2008   prostate  . Genetic testing 07/04/2017   Mr. Spradley underwent genetic counseling and testing for hereditary cancer syndromes on 06/12/2017. His results were negative for mutations in all 83 genes analyzed by [facility]'s 83-gene Multi-Cancers Panel. Genes analyzed include: ALK, APC, ATM, AXIN2, BAP1, BARD1, BLM, BMPR1A, BRCA1, BRCA2, BRIP1, CASR, CDC73, CDH1, CDK4, CDKN1B, CDKN1C, CDKN2A, CEBPA, CHEK2, CTNNA1, DICER1, DIS3L2, EGFR, EPCAM, FH  . History of radiation therapy 01/19/18- 02/13/18   Right axilla treated to 48 Gy with 20 fx of 2.4 Gy  . Hypertension   . Lung cancer ([place]) 07/2017  . Melanoma ([place]) 2013   x 4    Past Surgical History:  Procedure Laterality Date  . IR FLUORO GUIDE PORT INSERTION RIGHT  05/27/2017  . IR US GUIDE VASC ACCESS RIGHT  05/27/2017  . MELANOMA EXCISION Right    x4-abd & axillary  . PROSTATE SURGERY  2008   cancer  . ROTATOR CUFF REPAIR Left 1988  . THORACOSCOPY Left 08/25/2017   Thoracoscopy with Lobectomy Mediastinal and Regional Lymphadenectomy at [facility] by Dr. Marland Kitchen Haithcock    There were no vitals filed for this visit.  Subjective Assessment - 06/22/18 1609    Subjective  "It's getting better, actually." Took the bandages off Saturday; they loosened up  but didn't fall off, then he washed them.    Pertinent History  History of metastatic melanoma diagnosed in 2013 with resection of that, located in right flank.  Cancer metastasized as seen by scans, so had right axillary node dissection in January 2017.  Another tumor was found at axilla so he had chemo last summer, finishing in June 2018 (six treatment every two weeks). He had lung cancer diagnosed and had left lower lobectomy in August 2018. Now he will have radiation to right axilla starting 01/19/18; will have 20 treatments. He said his right arm swelling drained when he had the lung cancer surgery drain in.  Pt got a sleeve and glove at [place] and states it made his swelling worse.  HTN controlled with meds. h/o gout left foot and hand.    Patient Stated Goals  to get my arm smaller    Currently in Pain?  Yes    Pain Score  6     Pain Location  Shoulder    Pain Orientation  Right    Pain Descriptors / Indicators  Aching    Aggravating Factors   moving    Pain Relieving Factors  Advil                  Outpatient Rehab from 06/03/2018 in [place]  Lymphedema Life Impact Scale  Total Score  35.29 %           [facility] Adult PT Treatment/Exercise - 06/22/18 0001      Manual Therapy   Manual Lymphatic Drainage (MLD)  In Supine: Short neck, superficial and deep abdominals, Rt inguinal and Lt axilla nodes, Rt axillo-inguinal and anterior inter-axillary anastomsis then Rt UE from lateral upper arm to dorsal of hand working from proximal to distal then retracing all steps..    Compression Bandaging  lotion applied, then thin stockinette, Artiflex, 1-6, 1-10 and 1-12 cm short stretch compression bandages from hand to axilla, 2nd bandage in herringbone pattern                  PT Long Term Goals - 06/18/18 1703      PT LONG TERM GOAL #1   Title  Pt will obtain a night time garment (Tribute wrap) for long term management of  lymphedema    Status  On-going      PT LONG TERM GOAL #2   Title  Pt will be able to wear his custom flat knit sleeve and glove that he purchased in Jan 2019 for long term management of lymphedema.    Status  On-going      PT LONG TERM GOAL #3   Title  Pt will demonstrate a 4 cm decrease in circumference of right olecranon process to allow pt to fit into his compression garments    Status  On-going      PT LONG TERM GOAL #4   Title  Pt will be independent in a home exercise program for continued strengthening and stretching for R shoulder    Status  On-going            Plan - 06/22/18 1657    Clinical Impression Statement  Pt. feels the bandaging is working. Arm tissue felt soft today.    Rehab Potential  Good    Clinical Impairments Affecting Rehab Potential  hx of radiation and recurrence of melanoma    PT Frequency  3x / week    PT Duration  4 weeks    PT Treatment/Interventions  ADLs/Self Care Home Management;DME Instruction;Therapeutic exercise;Patient/family education;Orthotic Fit/Training;Manual techniques;Manual lymph drainage;Compression bandaging;Scar mobilization;Passive range of motion;Therapeutic activities;Vasopneumatic Device;Taping    PT Next Visit Plan  Remeasure. Issue remedial exercises. Cont complete decongestive therapy including bandaging to Rt UE until reduced to the size he was in Jan 2019;     Consulted and Agree with Plan of Care  Patient       Patient will benefit from skilled therapeutic intervention in order to improve the following deficits and impairments:  Increased edema, Decreased knowledge of precautions, Decreased knowledge of use of DME, Decreased strength, Decreased range of motion, Impaired UE functional use, Pain  Visit Diagnosis: Lymphedema, not elsewhere classified  Chronic right shoulder pain     Problem List Patient Active Problem List   Diagnosis Date Noted  . 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 Genetic testing 07/04/2017   . Port catheter in place 06/12/2017  . Goals of care, counseling/discussion 04/25/2017  . Melanoma of skin ([place]) 04/25/2017    Carlton Sweaney 06/22/2018, 4:58 PM  [place], [name], [address] Phone: [phone]   Fax:  [phone]  Name: Meshilem Machuca MRN: [id number] Date of Birth: Mar 08, 1951  Serafina Royals, PT 06/22/18 4:58 PM

## 2018-06-24 ENCOUNTER — Ambulatory Visit: Payer: Managed Care, Other (non HMO) | Admitting: Physical Therapy

## 2018-06-25 ENCOUNTER — Ambulatory Visit: Payer: Managed Care, Other (non HMO) | Admitting: Rehabilitation

## 2018-07-06 ENCOUNTER — Ambulatory Visit: Payer: Managed Care, Other (non HMO) | Attending: Radiation Oncology | Admitting: Rehabilitation

## 2018-07-06 ENCOUNTER — Encounter: Payer: Self-pay | Admitting: Rehabilitation

## 2018-07-06 DIAGNOSIS — I890 Lymphedema, not elsewhere classified: Secondary | ICD-10-CM | POA: Insufficient documentation

## 2018-07-06 DIAGNOSIS — M25611 Stiffness of right shoulder, not elsewhere classified: Secondary | ICD-10-CM | POA: Insufficient documentation

## 2018-07-06 DIAGNOSIS — Z85820 Personal history of malignant melanoma of skin: Secondary | ICD-10-CM | POA: Diagnosis present

## 2018-07-06 DIAGNOSIS — M25511 Pain in right shoulder: Secondary | ICD-10-CM | POA: Diagnosis present

## 2018-07-06 DIAGNOSIS — G8929 Other chronic pain: Secondary | ICD-10-CM

## 2018-07-06 NOTE — Therapy (Signed)
[place], [name], [address] Phone: [phone]   Fax:  [phone]  Physical Therapy Treatment  Patient Details  Name: William Wilkerson MRN: [id number] Date of Birth: 09-Jun-1951 Referring Provider: Dr. Isidore Moos   Encounter Date: 07/06/2018  PT End of Session - 07/06/18 2106    Visit Number  7    Number of Visits  14    Date for PT Re-Evaluation  08/03/18    PT Start Time  1602    PT Stop Time  1645    PT Time Calculation (min)  43 min    Activity Tolerance  Patient tolerated treatment well    Behavior During Therapy  [facility] for tasks assessed/performed       Past Medical History:  Diagnosis Date  . Cancer [facility]) 2008   prostate  . Genetic testing 07/04/2017   Mr. Magee underwent genetic counseling and testing for hereditary cancer syndromes on 06/12/2017. His results were negative for mutations in all 83 genes analyzed by [facility]'s 83-gene Multi-Cancers Panel. Genes analyzed include: ALK, APC, ATM, AXIN2, BAP1, BARD1, BLM, BMPR1A, BRCA1, BRCA2, BRIP1, CASR, CDC73, CDH1, CDK4, CDKN1B, CDKN1C, CDKN2A, CEBPA, CHEK2, CTNNA1, DICER1, DIS3L2, EGFR, EPCAM, FH  . History of radiation therapy 01/19/18- 02/13/18   Right axilla treated to 48 Gy with 20 fx of 2.4 Gy  . Hypertension   . Lung cancer ([place]) 07/2017  . Melanoma ([place]) 2013   x 4    Past Surgical History:  Procedure Laterality Date  . IR FLUORO GUIDE PORT INSERTION RIGHT  05/27/2017  . IR US GUIDE VASC ACCESS RIGHT  05/27/2017  . MELANOMA EXCISION Right    x4-abd & axillary  . PROSTATE SURGERY  2008   cancer  . ROTATOR CUFF REPAIR Left 1988  . THORACOSCOPY Left 08/25/2017   Thoracoscopy with Lobectomy Mediastinal and Regional Lymphadenectomy at [facility] by Dr. Marland Kitchen Haithcock    There were no vitals filed for this visit.  Subjective Assessment - 07/06/18 1604    Subjective  "hurting" a little more actually in the shoulder  No known reason.  bandages have  been off for about 2 weeks now with the holiay break.  It doesn't seem too bad    Pertinent History  History of metastatic melanoma diagnosed in 2013 with resection of that, located in right flank.  Cancer metastasized as seen by scans, so had right axillary node dissection in January 2017.  Another tumor was found at axilla so he had chemo last summer, finishing in June 2018 (six treatment every two weeks). He had lung cancer diagnosed and had left lower lobectomy in August 2018. Now he will have radiation to right axilla starting 01/19/18; will have 20 treatments. He said his right arm swelling drained when he had the lung cancer surgery drain in.  Pt got a sleeve and glove at [place] and states it made his swelling worse.  HTN controlled with meds. h/o gout left foot and hand.    Currently in Pain?  Yes    Pain Score  6     Pain Location  Shoulder    Pain Orientation  Right    Pain Descriptors / Indicators  Aching    Pain Type  Chronic pain            LYMPHEDEMA/ONCOLOGY QUESTIONNAIRE - 07/06/18 1609      Right Upper Extremity Lymphedema   10 cm Proximal to Olecranon Process  37.5 cm  Olecranon Process  35.5 cm    15 cm Proximal to Ulnar Styloid Process  34.3 cm    10 cm Proximal to Ulnar Styloid Process  29.3 cm    Just Proximal to Ulnar Styloid Process  21.3 cm    Across Hand at [employer]  24.5 cm    At [place] of 2nd Digit  7.8 cm           Outpatient Re[facility]/03/[facility]  Lymphedema Life Impact Scale Total Score  35.29 %           [facility] Adult PT Treatment/Exercise - 07/06/18 0001      Manual Therapy   Manual Lymphatic Drainage (MLD)  In Supine: Short neck, superficial and deep abdominals, Rt inguinal and Lt axilla nodes, Rt axillo-inguinal and anterior inter-axillary anastomsis then Rt UE from lateral upper arm to dorsal of hand working from proximal to distal then retracing all steps..    Compression  Bandaging  lotion applied, then thin stockinette, Artiflex, 1-6, 1-10 and 1-12 cm short stretch compression bandages from hand to axilla, 2nd bandage in herringbone pattern for more compression over forearm                  PT Long Term Goals - 07/06/18 2111      PT LONG TERM GOAL #1   Title  Pt will obtain a night time garment (Tribute wrap) for long term management of lymphedema    Status  On-going      PT LONG TERM GOAL #2   Title  Pt will be able to wear his custom flat knit sleeve and glove that he purchased in Jan 2019 for long term management of lymphedema.    Status  On-going      PT LONG TERM GOAL #3   Title  Pt will demonstrate a 4 cm decrease in circumference of right olecranon process to allow pt to fit into his compression garments    Status  On-going      PT LONG TERM GOAL #4   Title  Pt will be independent in a home exercise program for continued strengthening and stretching for R shoulder    Status  On-going            Plan - 07/06/18 2108    Clinical Impression Statement  Continues to do well even with the bandages off today. Reports that he is still too big to fit in the current compression garment he has.  Dates extended today.  Circumferential measurements about the same in the forearm and hand but improved by at least 1-1.5cm proximally.      Rehab Potential  Good    Clinical Impairments Affecting Rehab Potential  hx of radiation and recurrence of melanoma    PT Frequency  3x / week    PT Duration  4 weeks    PT Treatment/Interventions  ADLs/Self Care Home Management;DME Instruction;Therapeutic exercise;Patient/family education;Orthotic Fit/Training;Manual techniques;Manual lymph drainage;Compression bandaging;Scar mobilization;Passive range of motion;Therapeutic activities;Vasopneumatic Device;Taping    PT Next Visit Plan  Foam of some sort to help with forearm and hand?. Cont complete decongestive therapy including bandaging to Rt UE until reduced  to the size he was in Jan 2019;        Patient will benefit from skilled therapeutic intervention in order to improve the following deficits and impairments:  Increased edema, Decreased knowledge of precautions, Decreased knowledge of use of DME, Decreased strength, Decreased range of  motion, Impaired UE functional use, Pain  Visit Diagnosis: Lymphedema, not elsewhere classified  Chronic right shoulder pain  H/O Malignant melanoma  Stiffness of right shoulder, not elsewhere classified     Problem List Patient Active Problem List   Diagnosis Date Noted  . 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 Genetic testing 07/04/2017  . Port catheter in place 06/12/2017  . Goals of care, counseling/discussion 04/25/2017  . Melanoma of skin ([place]) 04/25/2017    Shan Levans, PT 07/06/2018, 9:12 PM  [place], [name], [address] Phone: [phone]   Fax:  [phone]  Name: Domonic Kimball MRN: [id number] Date of Birth: December 26, 1951

## 2018-07-08 ENCOUNTER — Encounter: Payer: Self-pay | Admitting: Rehabilitation

## 2018-07-08 ENCOUNTER — Ambulatory Visit: Payer: Managed Care, Other (non HMO) | Admitting: Rehabilitation

## 2018-07-08 DIAGNOSIS — G8929 Other chronic pain: Secondary | ICD-10-CM

## 2018-07-08 DIAGNOSIS — I890 Lymphedema, not elsewhere classified: Secondary | ICD-10-CM | POA: Diagnosis not present

## 2018-07-08 DIAGNOSIS — M25511 Pain in right shoulder: Secondary | ICD-10-CM

## 2018-07-08 DIAGNOSIS — M25611 Stiffness of right shoulder, not elsewhere classified: Secondary | ICD-10-CM

## 2018-07-08 DIAGNOSIS — Z85820 Personal history of malignant melanoma of skin: Secondary | ICD-10-CM

## 2018-07-08 NOTE — Therapy (Signed)
[place], [name], [address] Phone: [phone]   Fax:  [phone]  Physical Therapy Treatment  Patient Details  Name: William Wilkerson MRN: [id number] Date of Birth: 11-20-1951 Referring Provider: Dr. Isidore Moos   Encounter Date: 07/08/2018  PT End of Session - 07/08/18 1645    Visit Number  8    Number of Visits  14    Date for PT Re-Evaluation  08/03/18    PT Start Time  1600    PT Stop Time  1645    PT Time Calculation (min)  45 min    Activity Tolerance  Patient tolerated treatment well    Behavior During Therapy  [facility] for tasks assessed/performed       Past Medical History:  Diagnosis Date  . Cancer [facility]) 2008   prostate  . Genetic testing 07/04/2017   Mr. Bickle underwent genetic counseling and testing for hereditary cancer syndromes on 06/12/2017. His results were negative for mutations in all 83 genes analyzed by [facility]'s 83-gene Multi-Cancers Panel. Genes analyzed include: ALK, APC, ATM, AXIN2, BAP1, BARD1, BLM, BMPR1A, BRCA1, BRCA2, BRIP1, CASR, CDC73, CDH1, CDK4, CDKN1B, CDKN1C, CDKN2A, CEBPA, CHEK2, CTNNA1, DICER1, DIS3L2, EGFR, EPCAM, FH  . History of radiation therapy 01/19/18- 02/13/18   Right axilla treated to 48 Gy with 20 fx of 2.4 Gy  . Hypertension   . Lung cancer ([place]) 07/2017  . Melanoma ([place]) 2013   x 4    Past Surgical History:  Procedure Laterality Date  . IR FLUORO GUIDE PORT INSERTION RIGHT  05/27/2017  . IR US GUIDE VASC ACCESS RIGHT  05/27/2017  . MELANOMA EXCISION Right    x4-abd & axillary  . PROSTATE SURGERY  2008   cancer  . ROTATOR CUFF REPAIR Left 1988  . THORACOSCOPY Left 08/25/2017   Thoracoscopy with Lobectomy Mediastinal and Regional Lymphadenectomy at [facility] by Dr. Marland Kitchen Haithcock    There were no vitals filed for this visit.  Subjective Assessment - 07/08/18 1605    Subjective  The shoulder is feeling a bit     Pertinent History  History of metastatic  melanoma diagnosed in 2013 with resection of that, located in right flank.  Cancer metastasized as seen by scans, so had right axillary node dissection in January 2017.  Another tumor was found at axilla so he had chemo last summer, finishing in June 2018 (six treatment every two weeks). He had lung cancer diagnosed and had left lower lobectomy in August 2018. Now he will have radiation to right axilla starting 01/19/18; will have 20 treatments. He said his right arm swelling drained when he had the lung cancer surgery drain in.  Pt got a sleeve and glove at [place] and states it made his swelling worse.  HTN controlled with meds. h/o gout left foot and hand.    Currently in Pain?  Yes    Pain Score  5     Pain Location  Shoulder    Pain Orientation  Right    Pain Descriptors / Indicators  Aching    Pain Type  Chronic pain    Pain Onset  More than a month ago    Pain Frequency  Constant            LYMPHEDEMA/ONCOLOGY QUESTIONNAIRE - 07/08/18 1629      Right Upper Extremity Lymphedema   10 cm Proximal to Ulnar Styloid Process  28.4 cm    Just Proximal to Ulnar Styloid Process  21 cm    Across Hand at [employer]  23.2 cm    At [place] of 2nd Digit  7.5 cm           Outpatient Rehab from 06/03/2018 in [place]  Lymphedema Life Impact Scale Total Score  35.29 %           [facility] Adult PT Treatment/Exercise - 07/08/18 0001      Manual Therapy   Manual Therapy  Passive ROM    Edema Management  cut out forearm foam but pt had reduced forearm today so decided not to use it after checking in tomorrow.  Pt to bring or try current garment after these bandages come off    Manual Lymphatic Drainage (MLD)  In Supine: Short neck, superficial and deep abdominals, Rt inguinal and Lt axilla nodes, Rt axillo-inguinal and anterior inter-axillary anastomsis then Rt UE from lateral upper arm to dorsal of hand working from proximal to distal then  retracing all steps..    Compression Bandaging  lotion applied, then thin stockinette, Artiflex, 1-6, 1-10 and 1-12 cm short stretch compression bandages from hand to axilla, 2nd bandage in herringbone pattern for more compression over forearm    Passive ROM  to the Rt shoulder all directions to tolerance                  PT Long Term Goals - 07/06/18 2111      PT LONG TERM GOAL #1   Title  Pt will obtain a night time garment (Tribute wrap) for long term management of lymphedema    Status  On-going      PT LONG TERM GOAL #2   Title  Pt will be able to wear his custom flat knit sleeve and glove that he purchased in Jan 2019 for long term management of lymphedema.    Status  On-going      PT LONG TERM GOAL #3   Title  Pt will demonstrate a 4 cm decrease in circumference of right olecranon process to allow pt to fit into his compression garments    Status  On-going      PT LONG TERM GOAL #4   Title  Pt will be independent in a home exercise program for continued strengthening and stretching for R shoulder    Status  On-going            Plan - 07/08/18 1646    Clinical Impression Statement  Improved forearm status today with softer more pliable skin during MLD and decrease in forearm measurements by 1-1.5cm.  pt will try or bring current compression for next visit    PT Frequency  3x / week    PT Duration  4 weeks    PT Treatment/Interventions  ADLs/Self Care Home Management;DME Instruction;Therapeutic exercise;Patient/family education;Orthotic Fit/Training;Manual techniques;Manual lymph drainage;Compression bandaging;Scar mobilization;Passive range of motion;Therapeutic activities;Vasopneumatic Device;Taping    PT Next Visit Plan  current compression fit? foam piece for forearm? (on PTs desk) . Cont complete decongestive therapy including bandaging to Rt UE until reduced to the size he was in Jan 2019;        Patient will benefit from skilled therapeutic intervention  in order to improve the following deficits and impairments:  Increased edema, Decreased knowledge of precautions, Decreased knowledge of use of DME, Decreased strength, Decreased range of motion, Impaired UE functional use, Pain  Visit Diagnosis: Lymphedema, not elsewhere classified  Chronic right shoulder pain  Stiffness of right shoulder,  not elsewhere classified  H/O Malignant melanoma     Problem List Patient Active Problem List   Diagnosis Date Noted  . 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 Genetic testing 07/04/2017  . Port catheter in place 06/12/2017  . Goals of care, counseling/discussion 04/25/2017  . Melanoma of skin ([place]) 04/25/2017    Shan Levans, PT 07/08/2018, 4:48 PM  [place], [name], [address] Phone: [phone]   Fax:  [phone]  Name: William Wilkerson MRN: [id number] Date of Birth: 1951-04-17

## 2018-07-09 ENCOUNTER — Encounter: Payer: Medicare Other | Admitting: Physical Therapy

## 2018-07-13 ENCOUNTER — Encounter: Payer: Self-pay | Admitting: Rehabilitation

## 2018-07-13 ENCOUNTER — Ambulatory Visit: Payer: Managed Care, Other (non HMO) | Admitting: Rehabilitation

## 2018-07-13 DIAGNOSIS — M25611 Stiffness of right shoulder, not elsewhere classified: Secondary | ICD-10-CM

## 2018-07-13 DIAGNOSIS — I890 Lymphedema, not elsewhere classified: Secondary | ICD-10-CM

## 2018-07-13 DIAGNOSIS — G8929 Other chronic pain: Secondary | ICD-10-CM

## 2018-07-13 DIAGNOSIS — Z85820 Personal history of malignant melanoma of skin: Secondary | ICD-10-CM

## 2018-07-13 DIAGNOSIS — M25511 Pain in right shoulder: Secondary | ICD-10-CM

## 2018-07-13 NOTE — Therapy (Addendum)
[place], [name], [address] Phone: [phone]   Fax:  [phone]  Physical Therapy Treatment  Patient Details  Name: William Wilkerson MRN: [id number] Date of Birth: [birth year] Referring Provider: Dr. Isidore Moos   Encounter Date: 07/13/2018  PT End of Session - 07/13/18 1702    Visit Number  9    Number of Visits  14    Date for PT Re-Evaluation  08/03/18    PT Start Time  7209    PT Stop Time  1645    PT Time Calculation (min)  40 min    Activity Tolerance  Patient tolerated treatment well    Behavior During Therapy  [facility] for tasks assessed/performed       Past Medical History:  Diagnosis Date  . Cancer [facility]) 2008   prostate  . Genetic testing 07/04/2017   [name] underwent genetic counseling and testing for hereditary cancer syndromes on 06/12/2017. His results were negative for mutations in all 83 genes analyzed by [facility]'s 83-gene Multi-Cancers Panel. Genes analyzed include: ALK, APC, ATM, AXIN2, BAP1, BARD1, BLM, BMPR1A, BRCA1, BRCA2, BRIP1, CASR, CDC73, CDH1, CDK4, CDKN1B, CDKN1C, CDKN2A, CEBPA, CHEK2, CTNNA1, DICER1, DIS3L2, EGFR, EPCAM, FH  . History of radiation therapy 01/19/18- 02/13/18   Right axilla treated to 48 Gy with 20 fx of 2.4 Gy  . Hypertension   . Lung cancer ([place]) 07/2017  . Melanoma ([place]) 2013   x 4    Past Surgical History:  Procedure Laterality Date  . IR FLUORO GUIDE PORT INSERTION RIGHT  05/27/2017  . IR US GUIDE VASC ACCESS RIGHT  05/27/2017  . MELANOMA EXCISION Right    x4-abd & axillary  . PROSTATE SURGERY  2008   cancer  . ROTATOR CUFF REPAIR Left 1988  . THORACOSCOPY Left 08/25/2017   Thoracoscopy with Lobectomy Mediastinal and Regional Lymphadenectomy at [facility] by Dr. Marland Kitchen Haithcock    There were no vitals filed for this visit.  Subjective Assessment - 07/13/18 1606    Subjective  Tried compression.  It was still too tight.  Still refuses to add foam padding due  to heat    Pertinent History  History of metastatic melanoma diagnosed in 2013 with resection of that, located in right flank.  Cancer metastasized as seen by scans, so had right axillary node dissection in January 2017.  Another tumor was found at axilla so he had chemo last summer, finishing in June 2018 (six treatment every two weeks). He had lung cancer diagnosed and had left lower lobectomy in August 2018. Now he will have radiation to right axilla starting 01/19/18; will have 20 treatments. He said his right arm swelling drained when he had the lung cancer surgery drain in.  Pt got a sleeve and glove at [place] and states it made his swelling worse.  HTN controlled with meds. h/o gout left foot and hand.    Currently in Pain?  Yes    Pain Score  5     Pain Location  Shoulder    Pain Orientation  Right    Pain Descriptors / Indicators  Aching    Aggravating Factors   moving    Pain Relieving Factors  advil                  Outpatient Re[facility] from [facility]03/[facility]  Lymphedema Life Impact Scale Total Score  35.29 %  [place] Adult PT Treatment/Exercise - 07/13/18 0001      Manual Therapy   Manual therapy comments  arrived with increased size of arm today visually     Manual Lymphatic Drainage (MLD)  In Supine: Short neck, superficial and deep abdominals, Rt inguinal and Lt axilla nodes, Rt axillo-inguinal and anterior inter-axillary anastomsis then Rt UE from lateral upper arm to dorsal of hand working from proximal to distal then retracing all steps..    Compression Bandaging  lotion applied, then thin stockinette, Artiflex, 1-6, 1-10 and 1-12 cm short stretch compression bandages from hand to axilla, 2nd bandage in herringbone pattern for more compression over forearm                  PT Long Term Goals - 07/06/18 2111      PT LONG TERM GOAL #1   Title  Pt will obtain a night time garment (Tribute wrap) for  long term management of lymphedema    Status  On-going      PT LONG TERM GOAL #2   Title  Pt will be able to wear his custom flat knit sleeve and glove that he purchased in Jan 2019 for long term management of lymphedema.    Status  On-going      PT LONG TERM GOAL #3   Title  Pt will demonstrate a 4 cm decrease in circumference of right olecranon process to allow pt to fit into his compression garments    Status  On-going      PT LONG TERM GOAL #4   Title  Pt will be independent in a home exercise program for continued strengthening and stretching for R shoulder    Status  On-going            Plan - 07/13/18 1702    Clinical Impression Statement  Increased forearm size today visually due to no bandages for awhile.  improved skin status with MLD work.  Still unable to talk pt into foam or padding for forearm.  Tried compression sleeve and still too tight     Clinical Impairments Affecting Rehab Potential  hx of radiation and recurrence of melanoma    PT Treatment/Interventions  ADLs/Self Care Home Management;DME Instruction;Therapeutic exercise;Patient/family education;Orthotic Fit/Training;Manual techniques;Manual lymph drainage;Compression bandaging;Scar mobilization;Passive range of motion;Therapeutic activities;Vasopneumatic Device;Taping    PT Next Visit Plan   foam piece for forearm? (on PTs desk) . Cont complete decongestive therapy including bandaging to Rt UE until reduced to the size he was in Jan 2019;        Patient will benefit from skilled therapeutic intervention in order to improve the following deficits and impairments:  Increased edema, Decreased knowledge of precautions, Decreased knowledge of use of DME, Decreased strength, Decreased range of motion, Impaired UE functional use, Pain  Visit Diagnosis: Lymphedema, not elsewhere classified  Chronic right shoulder pain  Stiffness of right shoulder, not elsewhere classified  H/O Malignant melanoma     Problem  List Patient Active Problem List   Diagnosis Date Noted  . 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 Genetic testing 07/04/2017  . Port catheter in place 06/12/2017  . Goals of care, counseling/discussion 04/25/2017  . Melanoma of skin ([place]) 04/25/2017    Shan Levans, PT 07/13/2018, 5:04 PM  [place], [name], [address] Phone: [phone]   Fax:  [phone]  Name: William Wilkerson MRN: [id number] Date of Birth: 30-Jul-1951   PHYSICAL THERAPY DISCHARGE SUMMARY  Visits from Start of Care: 9  Current functional level related to goals / functional outcomes: Pt called and said he had a return of lung cancer and was needing to have treatment for this    Remaining deficits: See above  Education / Equipment: Lymphedema, compression, and self MLD Plan: Patient agrees to discharge.  Patient goals were not met. Patient is being discharged due to a change in medical status.  ?????       Shan Levans, PT 10/02/18   8:38am

## 2018-07-15 ENCOUNTER — Ambulatory Visit: Payer: Managed Care, Other (non HMO) | Admitting: Rehabilitation

## 2018-07-17 ENCOUNTER — Encounter

## 2018-07-22 ENCOUNTER — Ambulatory Visit: Payer: Managed Care, Other (non HMO)

## 2018-07-23 ENCOUNTER — Ambulatory Visit: Payer: Managed Care, Other (non HMO) | Admitting: Rehabilitation

## 2018-07-29 ENCOUNTER — Ambulatory Visit: Payer: Managed Care, Other (non HMO)

## 2018-08-07 ENCOUNTER — Encounter

## 2018-08-17 ENCOUNTER — Encounter: Payer: Medicare Other | Admitting: Rehabilitation

## 2018-08-20 ENCOUNTER — Encounter: Payer: Medicare Other | Admitting: Physical Therapy

## 2018-12-31 ENCOUNTER — Encounter: Payer: Self-pay | Admitting: Rehabilitation

## 2018-12-31 ENCOUNTER — Ambulatory Visit: Payer: Managed Care, Other (non HMO) | Attending: Specialist | Admitting: Rehabilitation

## 2018-12-31 ENCOUNTER — Other Ambulatory Visit: Payer: Self-pay

## 2018-12-31 DIAGNOSIS — G8929 Other chronic pain: Secondary | ICD-10-CM | POA: Insufficient documentation

## 2018-12-31 DIAGNOSIS — M25511 Pain in right shoulder: Secondary | ICD-10-CM | POA: Insufficient documentation

## 2018-12-31 DIAGNOSIS — M25611 Stiffness of right shoulder, not elsewhere classified: Secondary | ICD-10-CM | POA: Insufficient documentation

## 2018-12-31 DIAGNOSIS — Z85820 Personal history of malignant melanoma of skin: Secondary | ICD-10-CM | POA: Diagnosis present

## 2018-12-31 DIAGNOSIS — I890 Lymphedema, not elsewhere classified: Secondary | ICD-10-CM | POA: Insufficient documentation

## 2018-12-31 NOTE — Patient Instructions (Signed)
Get circaid reduction kit  

## 2018-12-31 NOTE — Therapy (Signed)
[place], [name], [address] Phone: [phone]   Fax:  [phone]  Physical Therapy Treatment  Patient Details  Name: William Wilkerson MRN: [id number] Date of Birth: 1951/03/18 Referring Provider (PT): Sherral Hammers, MD   Encounter Date: 12/31/2018  PT End of Session - 12/31/18 1714    Visit Number  1    Number of Visits  12    Date for PT Re-Evaluation  02/11/19    Authorization Type  medicare    PT Start Time  1602    PT Stop Time  1645    PT Time Calculation (min)  43 min    Activity Tolerance  Patient tolerated treatment well    Behavior During Therapy  [facility] for tasks assessed/performed       Past Medical History:  Diagnosis Date  . Cancer [facility]) 2008   prostate  . Genetic testing 07/04/2017   [name] underwent genetic counseling and testing for hereditary cancer syndromes on 06/12/2017. His results were negative for mutations in all 83 genes analyzed by [facility]'s 83-gene Multi-Cancers Panel. Genes analyzed include: ALK, APC, ATM, AXIN2, BAP1, BARD1, BLM, BMPR1A, BRCA1, BRCA2, BRIP1, CASR, CDC73, CDH1, CDK4, CDKN1B, CDKN1C, CDKN2A, CEBPA, CHEK2, CTNNA1, DICER1, DIS3L2, EGFR, EPCAM, FH  . History of radiation therapy 01/19/18- 02/13/18   Right axilla treated to 48 Gy with 20 fx of 2.4 Gy  . Hypertension   . Lung cancer ([place]) 07/2017  . Melanoma ([place]) 2013   x 4    Past Surgical History:  Procedure Laterality Date  . IR FLUORO GUIDE PORT INSERTION RIGHT  05/27/2017  . IR US GUIDE VASC ACCESS RIGHT  05/27/2017  . MELANOMA EXCISION Right    x4-abd & axillary  . PROSTATE SURGERY  2008   cancer  . ROTATOR CUFF REPAIR Left 1988  . THORACOSCOPY Left 08/25/2017   Thoracoscopy with Lobectomy Mediastinal and Regional Lymphadenectomy at [facility] by Dr. Marland Kitchen Haithcock    There were no vitals filed for this visit.  Subjective Assessment - 12/31/18 1605    Subjective  worsening of swelling after last  surgery.      Pertinent History  since last therapy.  Return of lung cancer Rt side with Thoracoscopy with Lobectomy Mediastinal and Regional Lymphadenectomy at [facility] by Dr. Waldemar Dickens.  Going back 01/19/19 for a repeat scan due to stuff on both lungs "they dont like".   History of metastatic melanoma diagnosed in 2013 with resection of that, located in right flank.  Cancer metastasized as seen by scans, so had right axillary node dissection in January 2017.  Another tumor was found at axilla so he had chemo last summer, finishing in June 2018 (six treatment every two weeks). He had lung cancer diagnosed and had left lower lobectomy in August 2018. Now he will have radiation to right axilla starting 01/19/18; will have 20 treatments. He said his right arm swelling drained when he had the lung cancer surgery drain in.  Pt got a sleeve and glove at [place] and states it made his swelling worse.  HTN controlled with meds. h/o gout left foot and hand.     Limitations  Writing;Lifting   i try not to use it at work    Patient Stated Goals  get swelling under control    Currently in Pain?  Yes    Pain Score  4     Pain Location  Arm   mostly the joints  Pain Orientation  Right    Pain Descriptors / Indicators  Aching;Discomfort    Pain Type  Acute pain    Pain Onset  More than a month ago    Pain Frequency  Constant    Aggravating Factors   nothing    Pain Relieving Factors  advil    Effect of Pain on Daily Activities  hard to use my Rt arm          [facility] PT Assessment - 12/31/18 0001      Assessment   Medical Diagnosis  Rt UE lymphedema    Referring Provider (PT)  stergios moschos, MD    Onset Date/Surgical Date  12/31/15   approx   Hand Dominance  Right    Next MD Visit  soon    Prior Therapy  yes      Precautions   Precaution Comments  cancer, lymphedema      Restrictions   Weight Bearing Restrictions  No      Balance Screen   Has the patient fallen in the past 6  months  No    Has the patient had a decrease in activity level because of a fear of falling?   No    Is the patient reluctant to leave their home because of a fear of falling?   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    Leisure  none      Cognition   Overall Cognitive Status  Within Functional Limits for tasks assessed      Observation/Other Assessments   Observations  Rt arm larger upon returning today      Coordination   Gross Motor Movements are Fluid and Coordinated  Yes      ROM / Strength   AROM / PROM / Strength  AROM;PROM      AROM   AROM Assessment Site  Shoulder    Right/Left Shoulder  Right;Left        LYMPHEDEMA/ONCOLOGY QUESTIONNAIRE - 12/31/18 1616      Type   Cancer Type  metastatic melanoma; lung cancer      Surgeries   Axillary Lymph Node Dissection Date  01/19/16    Other Surgery Date  --   history of Lt lung resection and Rt wedge resection   Number Lymph Nodes Removed  28      Date Lymphedema/Swelling Started   Date  01/19/16      Treatment   Active Chemotherapy Treatment  No    Past Chemotherapy Treatment  Yes    Active Radiation Treatment  No    Past Radiation Treatment  Yes      What other symptoms do you have   Are you Having Heaviness or Tightness  Yes    Are you having Pain  Yes    Are you having pitting edema  Yes    Body Site  right arm    Do you have infections  No      Lymphedema Assessments   Lymphedema Assessments  Upper extremities      Right Upper Extremity Lymphedema   15 cm Proximal to Olecranon Process  37.5 cm    10 cm Proximal to Olecranon Process  40 cm    Olecranon Process  38.4 cm    15 cm Proximal to Ulnar Styloid Process  34.7 cm    10 cm Proximal  to Ulnar Styloid Process  29.5 cm    Just Proximal to Ulnar Styloid Process  22.2 cm    Across Hand at [employer]  24.7 cm    At [place] of 2nd Digit  8.2 cm    Other  14.5    Other  4337m      Left Upper Extremity Lymphedema   15 cm Proximal to Olecranon Process  35 cm    10 cm Proximal to Olecranon Process  32.2 cm    Olecranon Process  32 cm    15 cm Proximal to Ulnar Styloid Process  29.9 cm    10 cm Proximal to Ulnar Styloid Process  24.3 cm    Just Proximal to Ulnar Styloid Process  19.1 cm    Across Hand at [employer] 23.5 cm    [place] 2[place] Digit  7.6 cm    Other  11.5    Other  30422m          Outpatient Re[facility] from [facility]  Lymphedema Life Impact Scale Total Score  35.29 %                   PT Education - 12/31/18 1714    Education Details  treatment options    Person(s) Educated  Patient    Methods  Explanation    Comprehension  Verbalized understanding          PT Long Term Goals - 12/31/18 1721      PT LONG TERM GOAL #1   Title  Pt will obtain a night time garment for long term management of lymphedema    Time  6    Period  Weeks    Status  New    [place] Date  02/11/19      PT LONG TERM GOAL #2   Title  Pt will be able to wear his custom flat knit sleeve and glove that he purchased in Jan 2019 for long term management of lymphedema.    Time  6    Period  Weeks    Status  New    [place] Date  02/11/19      PT LONG TERM GOAL #3   Title  Pt will demonstrate a reduction in 10 and 15 cm proximal to the olecranon to at least 35cm in both     Time  6    Period  Weeks    Status  New    [place] Date  02/11/19      PT LONG TERM GOAL #4   Title  Pt will be independent in a home exercise program for continued strengthening and stretching for R shoulder    Time  6    Period  Weeks    Status  New    [place] Date  02/11/19            Plan - 12/31/18 1714    Clinical  Impression Statement  Pt returns to therapy today after having to stop due to new Rt sided lung cancer diagnosis and wedge resection on the Rt in August.  Pt reports all went well and he does not having any SOB or side  effects.  Pt does report some spots they don't like on both of his lungs now and he will be getting a new scan at the end of the month.  Pt reports his Rt UE feels larger as he has not been able to care for it since July.  Tried to wear his sleeve but it is too tight.  Measurements were not up significantly from last time in the forearm but are up in the upper arm.  Volume difference of 1241m between the sides and with fibrosis starting at the distal forearm.  Due to pt dislike for bandaging during the last sessions we decided to use a circaid reduction kit which he will purchase by the next visit for fitting.      History and Personal Factors relevant to plan of care:  since last therapy. Return of lung cancer Rt side with [facility] Going back 01/19/19 for a repeat scan due to stuff on both lungs "they dont like". History of metastatic melanoma diagnosed in 2013 with resection of that, located in right flank. Cancer metastasized as seen by scans, so had right axillary node dissection in January 2017. Another tumor was found at axilla so he had chemo last summer, finishing in June 2018 (six treatment every two weeks). He had lung cancer diagnosed and had left lower lobectomy in August 2018. Now he will have radiation to right axilla starting 01/19/18; will have 20 treatments. He said his right arm swelling drained when he had the lung cancer surgery drain in. [place] HTN controlled with meds. h/o gout left foot and hand.     Clinical Presentation  Evolving    Clinical Presentation due to:  cancer status unknown; worsening of swelling     Clinical Decision Making  Moderate    Rehab Potential  Good    PT Frequency  2x / week    PT Duration  6 weeks    PT Treatment/Interventions  ADLs/Self Care Home Management;Therapeutic exercise;Patient/family education;Manual lymph drainage;Compression bandaging;Taping;Passive range of motion    PT Next Visit Plan  prescription back? send to a special place if it is for tax refund.  fit circaid with instructions on wear; MLD Rt UE as able, then begin CDT Rt UE with independent focus due to pt work schedule working towards fitting back in to his sleeve    Recommended Other Services  circaid;     Consulted and Agree with Plan of Care  Patient       Patient will benefit from skilled therapeutic intervention in order to improve the following deficits and impairments:  Decreased skin integrity, Increased edema, Impaired perceived functional ability, Decreased activity tolerance, Impaired UE functional use, Pain  Visit Diagnosis: Lymphedema, not elsewhere classified  Chronic right shoulder pain  Stiffness of right shoulder, not elsewhere classified  H/O Malignant melanoma     Problem List Patient Active Problem List   Diagnosis Date Noted  . [place]  . Genetic testing 07/04/2017  . Port catheter in place 06/12/2017  . Goals of care, counseling/discussion 04/25/2017  . [place] 04/25/2017    KShan Levans PT 12/31/2018, 5:23 PM  [place] [address]Phone: [phone]  Fax:  [phone] Name: TCletus ParisMRN: [id number] of Birth: [birth year]

## 2019-01-06 ENCOUNTER — Ambulatory Visit: Payer: Managed Care, Other (non HMO)

## 2019-01-06 DIAGNOSIS — I890 Lymphedema, not elsewhere classified: Secondary | ICD-10-CM

## 2019-01-06 DIAGNOSIS — M25511 Pain in right shoulder: Secondary | ICD-10-CM

## 2019-01-06 DIAGNOSIS — G8929 Other chronic pain: Secondary | ICD-10-CM

## 2019-01-06 DIAGNOSIS — M25611 Stiffness of right shoulder, not elsewhere classified: Secondary | ICD-10-CM

## 2019-01-06 DIAGNOSIS — Z85820 Personal history of malignant melanoma of skin: Secondary | ICD-10-CM

## 2019-01-06 NOTE — Therapy (Signed)
[place], [name], [address] Phone: [phone]   Fax:  [phone]  Physical Therapy Treatment  Patient Details  Name: William Wilkerson MRN: [id number] Date of Birth: 1951/08/01 Referring Provider (PT): Sherral Hammers, MD   Encounter Date: 01/06/2019  PT End of Session - 01/06/19 1703    Visit Number  2    Number of Visits  12    Date for PT Re-Evaluation  02/11/19    PT Start Time  1606    PT Stop Time  1657    PT Time Calculation (min)  51 min    Activity Tolerance  Patient tolerated treatment well    Behavior During Therapy  [facility] for tasks assessed/performed       Past Medical History:  Diagnosis Date  . Cancer [facility]) 2008   prostate  . Genetic testing 07/04/2017   Mr. Speltz underwent genetic counseling and testing for hereditary cancer syndromes on 06/12/2017. His results were negative for mutations in all 83 genes analyzed by [facility]'s 83-gene Multi-Cancers Panel. Genes analyzed include: ALK, APC, ATM, AXIN2, BAP1, BARD1, BLM, BMPR1A, BRCA1, BRCA2, BRIP1, CASR, CDC73, CDH1, CDK4, CDKN1B, CDKN1C, CDKN2A, CEBPA, CHEK2, CTNNA1, DICER1, DIS3L2, EGFR, EPCAM, FH  . History of radiation therapy 01/19/18- 02/13/18   Right axilla treated to 48 Gy with 20 fx of 2.4 Gy  . Hypertension   . Lung cancer ([place]) 07/2017  . Melanoma ([place]) 2013   x 4    Past Surgical History:  Procedure Laterality Date  . IR FLUORO GUIDE PORT INSERTION RIGHT  05/27/2017  . IR US GUIDE VASC ACCESS RIGHT  05/27/2017  . MELANOMA EXCISION Right    x4-abd & axillary  . PROSTATE SURGERY  2008   cancer  . ROTATOR CUFF REPAIR Left 1988  . THORACOSCOPY Left 08/25/2017   Thoracoscopy with Lobectomy Mediastinal and Regional Lymphadenectomy at [facility] by Dr. Marland Kitchen Haithcock    There were no vitals filed for this visit.  Subjective Assessment - 01/06/19 1607    Subjective  I brought my Circaid reduction kit.     Pertinent History  since  last therapy.  Return of lung cancer Rt side with Thoracoscopy with Lobectomy Mediastinal and Regional Lymphadenectomy at [facility] by Dr. Waldemar Dickens.  Going back 01/19/19 for a repeat scan due to stuff on both lungs "they dont like".   History of metastatic melanoma diagnosed in 2013 with resection of that, located in right flank.  Cancer metastasized as seen by scans, so had right axillary node dissection in January 2017.  Another tumor was found at axilla so he had chemo last summer, finishing in June 2018 (six treatment every two weeks). He had lung cancer diagnosed and had left lower lobectomy in August 2018. Now he will have radiation to right axilla starting 01/19/18; will have 20 treatments. He said his right arm swelling drained when he had the lung cancer surgery drain in.  Pt got a sleeve and glove at [facility] and states it made his swelling worse.  HTN controlled with meds. h/o gout left foot and hand.     Patient Stated Goals  get swelling under control    Currently in Pain?  Yes    Pain Score  6     Pain Location  Shoulder    Pain Orientation  Right    Pain Descriptors / Indicators  Aching    Pain Type  Chronic pain   from heaviness of arm d/t  lymphedema   Pain Onset  More than a month ago    Pain Frequency  Constant    Aggravating Factors   increased swelling of arm     Pain Relieving Factors  advil helps some                  Outpatient [facility]  Lymphedema Life Impact Scale Total Score  35.29 %           [facility] Adult PT Treatment/Exercise - 01/06/19 0001      Manual Therapy   Manual Therapy  Compression Bandaging    Compression Bandaging  Pt brought in his new Circaid Reduction kit so cut down straps and fitted this to him instructing him in proper donning throughout. Alsi instructed him in use of color coded compression card to assure that the straps are applying gradient pressure compression.  Also issued 2 different pieces of compression foam for pt to wear at dorsum of hand to fill in gap of hand piece: large dotted peach Medi foam, and Komprex. Tried both and for now pt likes Komprex best but issued both for trial. Pt very pleased with garment and fit of at end of session.                   PT Long Term Goals - 12/31/18 1721      PT LONG TERM GOAL #1   Title  Pt will obtain a night time garment for long term management of lymphedema    Time  6    Period  Weeks    Status  New    Target Date  02/11/19      PT LONG TERM GOAL #2   Title  Pt will be able to wear his custom flat knit sleeve and glove that he purchased in Jan 2019 for long term management of lymphedema.    Time  6    Period  Weeks    Status  New    Target Date  02/11/19      PT LONG TERM GOAL #3   Title  Pt will demonstrate a reduction in 10 and 15 cm proximal to the olecranon to at least 35cm in both     Time  6    Period  Weeks    Status  New    Target Date  02/11/19      PT LONG TERM GOAL #4   Title  Pt will be independent in a home exercise program for continued strengthening and stretching for R shoulder    Time  6    Period  Weeks    Status  New    Target Date  02/11/19            Plan - 01/06/19 1704    Clinical Impression Statement  Pt brought new Cricaid Reduction kit today so spent entirety of session fitting this to him and instructing him in proper donning. Also issued 2 different foam pieces for trial to dorsum of hand. Pt was VERY pleased with garment and the fit of this.     Rehab Potential  Good    PT Frequency  2x / week    PT Duration  6 weeks    PT Treatment/Interventions  ADLs/Self Care Home Management;Therapeutic exercise;Patient/family education;Manual lymph drainage;Compression bandaging;Taping;Passive range of motion    PT Next Visit Plan  prescription back? (pt to F/U with phone call to doctor about script)  send to a special place if it is for tax refund.  Assess Circaid and remeasure for effectiveness; MLD Rt UE as able with independent focus due to pt work schedule working towards fitting back in to his sleeve    Consulted and Agree with Plan of Care  Patient       Patient will benefit from skilled therapeutic intervention in order to improve the following deficits and impairments:  Decreased skin integrity, Increased edema, Impaired perceived functional ability, Decreased activity tolerance, Impaired UE functional use, Pain  Visit Diagnosis: Lymphedema, not elsewhere classified  Chronic right shoulder pain  Stiffness of right shoulder, not elsewhere classified  H/O Malignant melanoma     Problem List Patient Active Problem List   Diagnosis Date Noted  . 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 Genetic testing 07/04/2017  . Port catheter in place 06/12/2017  . Goals of care, counseling/discussion 04/25/2017  . Melanoma of skin ([place]) 04/25/2017    Otelia Limes, PTA 01/06/2019, 5:21 PM  [place], [name], [address] Phone: [phone]   Fax:  [phone]  Name: Adar Rase MRN: [id number] Date of Birth: 04-04-1951

## 2019-01-11 ENCOUNTER — Encounter: Payer: Medicare Other | Admitting: Rehabilitation

## 2019-01-13 ENCOUNTER — Encounter: Payer: Self-pay | Admitting: Rehabilitation

## 2019-01-13 ENCOUNTER — Ambulatory Visit: Payer: Managed Care, Other (non HMO) | Admitting: Rehabilitation

## 2019-01-13 DIAGNOSIS — M25511 Pain in right shoulder: Secondary | ICD-10-CM

## 2019-01-13 DIAGNOSIS — I890 Lymphedema, not elsewhere classified: Secondary | ICD-10-CM

## 2019-01-13 DIAGNOSIS — M25611 Stiffness of right shoulder, not elsewhere classified: Secondary | ICD-10-CM

## 2019-01-13 DIAGNOSIS — G8929 Other chronic pain: Secondary | ICD-10-CM

## 2019-01-13 DIAGNOSIS — Z85820 Personal history of malignant melanoma of skin: Secondary | ICD-10-CM

## 2019-01-13 NOTE — Patient Instructions (Signed)
Wear circaid as much as possible Look for pump at home

## 2019-01-13 NOTE — Therapy (Signed)
[place], [place], [address] Phone: [phone]   Fax:  [phone]  Physical Therapy Treatment  Patient Details  Name: William Wilkerson MRN: [id number] Date of Birth: Feb 28, 1951 Referring Provider (PT): Sherral Hammers, MD   Encounter Date: 01/13/2019  PT End of Session - 01/13/19 1649    Visit Number  2    Number of Visits  12    Date for PT Re-Evaluation  02/11/19    PT Start Time  1602    PT Stop Time  1645    PT Time Calculation (min)  43 min    Activity Tolerance  Patient tolerated treatment well    Behavior During Therapy  [facility] for tasks assessed/performed       Past Medical History:  Diagnosis Date  . Cancer [facility]) 2008   prostate  . Genetic testing 07/04/2017   William Wilkerson underwent genetic counseling and testing for hereditary cancer syndromes on 06/12/2017. His results were negative for mutations in all 83 genes analyzed by [facility]'s 83-gene Multi-Cancers Panel. Genes analyzed include: ALK, APC, ATM, AXIN2, BAP1, BARD1, BLM, BMPR1A, BRCA1, BRCA2, BRIP1, CASR, CDC73, CDH1, CDK4, CDKN1B, CDKN1C, CDKN2A, CEBPA, CHEK2, CTNNA1, DICER1, DIS3L2, EGFR, EPCAM, FH  . History of radiation therapy 01/19/18- 02/13/18   Right axilla treated to 48 Gy with 20 fx of 2.4 Gy  . Hypertension   . Lung cancer ([place]) 07/2017  . Melanoma ([place]) 2013   x 4    Past Surgical History:  Procedure Laterality Date  . IR FLUORO GUIDE PORT INSERTION RIGHT  05/27/2017  . IR US GUIDE VASC ACCESS RIGHT  05/27/2017  . MELANOMA EXCISION Right    x4-abd & axillary  . PROSTATE SURGERY  2008   cancer  . ROTATOR CUFF REPAIR Left 1988  . THORACOSCOPY Left 08/25/2017   Thoracoscopy with Lobectomy Mediastinal and Regional Lymphadenectomy at [facility] by Dr. Marland Kitchen [place]    There were no vitals filed for this visit.  Subjective Assessment - 01/13/19 1605    Subjective  I have only been sleeping in it    Pertinent History  since last  therapy.  Return of lung cancer Rt side with Thoracoscopy with Lobectomy Mediastinal and Regional Lymphadenectomy at [facility] by Dr. Waldemar Wilkerson.  Going back 01/19/19 for a repeat scan due to stuff on both lungs "they dont like".   History of metastatic melanoma diagnosed in 2013 with resection of that, located in right flank.  Cancer metastasized as seen by scans, so had right axillary node dissection in January 2017.  Another tumor was found at axilla so he had chemo last summer, finishing in June 2018 (six treatment every two weeks). He had lung cancer diagnosed and had left lower lobectomy in August 2018. Now he will have radiation to right axilla starting 01/19/18; will have 20 treatments. He said his right arm swelling drained when he had the lung cancer surgery drain in.  Pt got a sleeve and glove at [facility] and states it made his swelling worse.  HTN controlled with meds. h/o gout left foot and hand.     Patient Stated Goals  get swelling under control    Currently in Pain?  Yes    Pain Score  4     Pain Location  Shoulder    Pain Orientation  Right    Pain Descriptors / Indicators  Aching    Pain Type  Chronic pain    Pain Onset  More  than a month ago    Pain Frequency  Constant                  Outpatient Rehab from 06/03/2018 in [place]  Lymphedema Life Impact Scale Total Score  35.29 %           [facility] Adult PT Treatment/Exercise - 01/13/19 0001      Manual Therapy   Manual Therapy  Manual Lymphatic Drainage (MLD)    Manual therapy comments  emphasized that not wearing the circaid during the day will keep his arm at the same size. Pt reports that he may have a pump at home from last time.  Will look for it.     Manual Lymphatic Drainage (MLD)  abdominals and deep breaths, Lt axillary nodes, interaxillary pathway, Rt UE from shoulder to hand with focus on the forearm working proximal to distal and returning steps                   PT Long Term Goals - 12/31/18 1721      PT LONG TERM GOAL #1   Title  Pt will obtain a night time garment for long term management of lymphedema    Time  6    Period  Weeks    Status  New    Target Date  02/11/19      PT LONG TERM GOAL #2   Title  Pt will be able to wear his custom flat knit sleeve and glove that he purchased in Jan 2019 for long term management of lymphedema.    Time  6    Period  Weeks    Status  New    Target Date  02/11/19      PT LONG TERM GOAL #3   Title  Pt will demonstrate a reduction in 10 and 15 cm proximal to the olecranon to at least 35cm in both     Time  6    Period  Weeks    Status  New    Target Date  02/11/19      PT LONG TERM GOAL #4   Title  Pt will be independent in a home exercise program for continued strengthening and stretching for R shoulder    Time  6    Period  Weeks    Status  New    Target Date  02/11/19            Plan - 01/13/19 1649    Clinical Impression Statement  Decreased skin mobility at the forearm today.  Pt reports he has only been wearing his circaid at night.  Emphasized the importance of wearing as much as possible.  will look for his pump at home.     PT Frequency  2x / week    PT Duration  6 weeks    PT Treatment/Interventions  ADLs/Self Care Home Management;Therapeutic exercise;Patient/family education;Manual lymph drainage;Compression bandaging;Taping;Passive range of motion    PT Next Visit Plan  prescription back? (pt to F/U with phone call to doctor about script) send to a special place if it is for tax refund. find pump? remeasure and emphasize wearing the garment during the day. MLD Rt UE as able with independent focus due to pt work schedule working towards fitting back in to his sleeve    Consulted and Agree with Plan of Care  Patient       Patient will benefit from skilled therapeutic intervention in order  to improve the following deficits and impairments:     Visit  Diagnosis: Lymphedema, not elsewhere classified  Chronic right shoulder pain  Stiffness of right shoulder, not elsewhere classified  H/O Malignant melanoma     Problem List Patient Active Problem List   Diagnosis Date Noted  . 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 Genetic testing 07/04/2017  . Port catheter in place 06/12/2017  . Goals of care, counseling/discussion 04/25/2017  . Melanoma of skin ([place]) 04/25/2017   Shan Levans, PT 01/13/2019, 4:52 PM  [place], [place], [address] Phone: [phone]   Fax:  [phone]  Name: William Wilkerson MRN: [id number] Date of Birth: 1951-01-19

## 2019-01-18 ENCOUNTER — Encounter: Payer: Medicare Other | Admitting: Rehabilitation

## 2019-01-20 ENCOUNTER — Ambulatory Visit: Payer: Managed Care, Other (non HMO) | Admitting: Rehabilitation

## 2019-01-20 ENCOUNTER — Encounter: Payer: Self-pay | Admitting: Rehabilitation

## 2019-01-20 DIAGNOSIS — I890 Lymphedema, not elsewhere classified: Secondary | ICD-10-CM | POA: Diagnosis not present

## 2019-01-20 DIAGNOSIS — M25511 Pain in right shoulder: Secondary | ICD-10-CM

## 2019-01-20 DIAGNOSIS — M25611 Stiffness of right shoulder, not elsewhere classified: Secondary | ICD-10-CM

## 2019-01-20 DIAGNOSIS — G8929 Other chronic pain: Secondary | ICD-10-CM

## 2019-01-20 DIAGNOSIS — Z85820 Personal history of malignant melanoma of skin: Secondary | ICD-10-CM

## 2019-01-20 NOTE — Therapy (Addendum)
[place], [name], [address] Phone: [phone]   Fax:  [phone]  Physical Therapy Treatment  Patient Details  Name: William Wilkerson MRN: [id number] Date of Birth: 1951/10/05 Referring Provider (PT): Sherral Hammers, MD   Encounter Date: 01/20/2019  PT End of Session - 01/20/19 1649    Visit Number  3    Number of Visits  12    Date for PT Re-Evaluation  02/11/19    PT Start Time  1600    PT Stop Time  1645    PT Time Calculation (min)  45 min    Activity Tolerance  Patient tolerated treatment well    Behavior During Therapy  [facility] for tasks assessed/performed       Past Medical History:  Diagnosis Date  . Cancer [facility]) 2008   prostate  . Genetic testing 07/04/2017   Mr. Loyer underwent genetic counseling and testing for hereditary cancer syndromes on 06/12/2017. His results were negative for mutations in all 83 genes analyzed by [facility]'s 83-gene Multi-Cancers Panel. Genes analyzed include: ALK, APC, ATM, AXIN2, BAP1, BARD1, BLM, BMPR1A, BRCA1, BRCA2, BRIP1, CASR, CDC73, CDH1, CDK4, CDKN1B, CDKN1C, CDKN2A, CEBPA, CHEK2, CTNNA1, DICER1, DIS3L2, EGFR, EPCAM, FH  . History of radiation therapy 01/19/18- 02/13/18   Right axilla treated to 48 Gy with 20 fx of 2.4 Gy  . Hypertension   . Lung cancer ([place]) 07/2017  . Melanoma ([place]) 2013   x 4    Past Surgical History:  Procedure Laterality Date  . IR FLUORO GUIDE PORT INSERTION RIGHT  05/27/2017  . IR US GUIDE VASC ACCESS RIGHT  05/27/2017  . MELANOMA EXCISION Right    x4-abd & axillary  . PROSTATE SURGERY  2008   cancer  . ROTATOR CUFF REPAIR Left 1988  . THORACOSCOPY Left 08/25/2017   Thoracoscopy with Lobectomy Mediastinal and Regional Lymphadenectomy at [facility] by Dr. Marland Kitchen Haithcock    There were no vitals filed for this visit.  Subjective Assessment - 01/20/19 1606    Subjective  My cancer is back.  In the Lt lung.  Getting a PET scan 02/16/19 go  from there.  I have not been great with wearing my sleeve.     Pertinent History  since last therapy.  Return of lung cancer Rt side with Thoracoscopy with Lobectomy Mediastinal and Regional Lymphadenectomy at [facility] by Dr. Waldemar Dickens.  Going back 01/19/19 for a repeat scan due to stuff on both lungs "they dont like".   History of metastatic melanoma diagnosed in 2013 with resection of that, located in right flank.  Cancer metastasized as seen by scans, so had right axillary node dissection in January 2017.  Another tumor was found at axilla so he had chemo last summer, finishing in June 2018 (six treatment every two weeks). He had lung cancer diagnosed and had left lower lobectomy in August 2018. Now he will have radiation to right axilla starting 01/19/18; will have 20 treatments. He said his right arm swelling drained when he had the lung cancer surgery drain in.  Pt got a sleeve and glove at [facility] and states it made his swelling worse.  HTN controlled with meds. h/o gout left foot and hand.     Patient Stated Goals  get swelling under control    Pain Score  4     Pain Location  Shoulder    Pain Orientation  Right    Pain Descriptors / Indicators  Aching  Pain Type  Chronic pain    Pain Onset  More than a month ago    Pain Frequency  Constant                  Outpatient [facility] from [facility]  Lymphedema Life Impact Scale Total Score  35.29 %           [facility] Adult PT Treatment/Exercise - 01/20/19 0001      Manual Therapy   Manual Therapy  Manual Lymphatic Drainage (MLD)    Manual Lymphatic Drainage (MLD)  short neck, abdominals and breathing, Lt axillary nodes and Rt iguinal nodes anterior interaxillary pathway avoiding port and Rt axillo inguinal pathway. Rt UE from shoulder to hand distal to proximal and then reversing all steps.  Applied thick lotion to arm and helped pt re apply circaid arm and hand                    PT Long Term Goals - 12/31/18 1721      PT LONG TERM GOAL #1   Title  Pt will obtain a night time garment for long term management of lymphedema    Time  6    Period  Weeks    Status  New    Target Date  02/11/19      PT LONG TERM GOAL #2   Title  Pt will be able to wear his custom flat knit sleeve and glove that he purchased in Jan 2019 for long term management of lymphedema.    Time  6    Period  Weeks    Status  New    Target Date  02/11/19      PT LONG TERM GOAL #3   Title  Pt will demonstrate a reduction in 10 and 15 cm proximal to the olecranon to at least 35cm in both     Time  6    Period  Weeks    Status  New    Target Date  02/11/19      PT LONG TERM GOAL #4   Title  Pt will be independent in a home exercise program for continued strengthening and stretching for R shoulder    Time  6    Period  Weeks    Status  New    Target Date  02/11/19            Plan - 01/20/19 1649    Clinical Impression Statement  improved skin mobility today after wearing the circaid for alot of the day.  Did not wear the hand piece so some increased swelling at the back of the hand.  Pt reports that his cancer is back in the left lung.  treatment unknown    PT Frequency  2x / week    PT Duration  6 weeks    PT Treatment/Interventions  ADLs/Self Care Home Management;Therapeutic exercise;Patient/family education;Manual lymph drainage;Compression bandaging;Taping;Passive range of motion    PT Next Visit Plan  prescription back? (pt to F/U with phone call to doctor about script) send to a special place if it is for tax refund. find pump? remeasure and emphasize wearing the garment during the day. MLD Rt UE as able with independent focus due to pt work schedule working towards fitting back in to his sleeve    Consulted and Agree with Plan of Care  Patient       Patient will benefit from skilled therapeutic intervention in  order to improve the following deficits  and impairments:  Decreased skin integrity, Increased edema, Impaired perceived functional ability, Decreased activity tolerance, Impaired UE functional use, Pain  Visit Diagnosis: Lymphedema, not elsewhere classified  Chronic right shoulder pain  Stiffness of right shoulder, not elsewhere classified  H/O Malignant melanoma     Problem List Patient Active Problem List   Diagnosis Date Noted  . 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 Genetic testing 07/04/2017  . Port catheter in place 06/12/2017  . Goals of care, counseling/discussion 04/25/2017  . Melanoma of skin ([place]) 04/25/2017    Shan Levans, PT 01/20/2019, 4:52 PM  [place], [name], [address] Phone: [phone]   Fax:  [phone]  Name: [facility] Wilkerson MRN: [id number]   PHYSICAL THERAPY DISCHARGE SUMMARY  Visits from Start of Care: 3  Current functional level related to goals / functional outcomes: Pt did not return after finding out lung cancer had returned.  Pt ind with circaid garment at this time.     Remaining deficits: Chronic lymphedema   Education / Equipment: HEP, self MLD, garments Plan: Patient agrees to discharge.  Patient goals were not met. Patient is being discharged due to not returning since the last visit.  ?????     Shan Levans, PT Date of Birth: [birth year]

## 2020-05-16 ENCOUNTER — Telehealth: Payer: Self-pay | Admitting: Oncology

## 2020-05-16 NOTE — Telephone Encounter (Signed)
Scheduled appt per 5/18 sch msg - left message with appt date and time

## 2020-05-19 ENCOUNTER — Inpatient Hospital Stay: Payer: Medicare Other

## 2020-05-22 ENCOUNTER — Other Ambulatory Visit: Payer: Self-pay

## 2020-05-22 ENCOUNTER — Inpatient Hospital Stay: Payer: Managed Care, Other (non HMO) | Attending: Oncology

## 2020-05-22 DIAGNOSIS — C439 Malignant melanoma of skin, unspecified: Secondary | ICD-10-CM | POA: Diagnosis not present

## 2020-05-22 DIAGNOSIS — Z452 Encounter for adjustment and management of vascular access device: Secondary | ICD-10-CM | POA: Insufficient documentation

## 2020-05-22 DIAGNOSIS — Z95828 Presence of other vascular implants and grafts: Secondary | ICD-10-CM

## 2020-05-22 MED ORDER — HEPARIN SOD (PORK) LOCK FLUSH 100 UNIT/ML IV SOLN
500.0000 [IU] | Freq: Once | INTRAVENOUS | Status: AC
  Administered 2020-05-22: 500 [IU]
  Filled 2020-05-22: qty 5

## 2020-05-22 MED ORDER — SODIUM CHLORIDE 0.9% FLUSH
10.0000 mL | Freq: Once | INTRAVENOUS | Status: AC
  Administered 2020-05-22: 10 mL
  Filled 2020-05-22: qty 10

## 2020-10-06 ENCOUNTER — Ambulatory Visit
Admission: RE | Admit: 2020-10-06 | Discharge: 2020-10-06 | Disposition: A | Discharge status: Home / Self Care | Payer: Self-pay | Source: Ambulatory Visit | Attending: Radiation Oncology | Admitting: Radiation Oncology

## 2020-10-06 ENCOUNTER — Other Ambulatory Visit: Payer: Self-pay | Admitting: Radiation Oncology

## 2020-10-06 DIAGNOSIS — C439 Malignant melanoma of skin, unspecified: Secondary | ICD-10-CM

## 2020-10-10 ENCOUNTER — Telehealth: Payer: Self-pay | Admitting: *Deleted

## 2020-10-10 NOTE — Telephone Encounter (Signed)
LVM for call back to schedule appointment with Dr. Isidore Moos

## 2020-10-12 NOTE — Progress Notes (Signed)
Histology and Location of Primary Cancer:  Metastatic melanoma  Sites of Visceral and Bony Metastatic Disease:  09/18/2020 Final Diagnosis   A: Bone, left lytic iliac lesion, core biopsy -Metastatic melanoma; see comment  09/08/2020 --CT chest   Stable postoperative sequelae of right upper lobe wedge resection and left lower lobectomy.  Multiple new scattered right lower lobe groundglass nodule; indeterminate. Recommend follow-up CT in 3 months to ensure resolution.  Additional stable scattered subcentimeter pulmonary nodules, subpleural reticular opacities and atelectasis in left upper and lower lobes. New small loculated right apical pleural effusion and small left pleural effusion. --CT abd/pel  Multiple new lytic osseous metastases including left iliac bone large lytic lesion with soft tissue component extending to the adjacent muscles.  There is also a new 1.0 cm soft tissue nodule within the left upper quadrant concerning for omental implant   Location(s) of Symptomatic Metastases:  Left hip  Past/Anticipated chemotherapy by medical oncology, if any:  Under care of Dr, Zola Button -Received Nivolumab 240 mg every 2 weeks 05/02/2017--06/27/2017  Pain on a scale of 0-10 is: Reports pain 5 out of 10 to left hip; sometimes it's more, sometimes less, but consistently a 5 out of 10  If Spine Met(s), symptoms, if any, include:  Bowel/Bladder retention or incontinence (please describe): Patient denies (though he does report his BMs alternate between constipation and diarrhea)  Numbness or weakness in extremities (please describe): Reports occasional numbness to the left arm (he believes it's related to a disk/compression issue)  Current Decadron regimen, if applicable: N/A  Ambulatory status? Walker? Wheelchair?:  Patient reports he is able to ambulate unassisted, but does admit that he would probably be more steady on his feet with an assistive device like a cane or  walker  SAFETY ISSUES:  Prior radiation? Yes: 01/19/2018 - 02/13/2018 Right axilla treated to 48 Gy with 20 fx of 2.4 Gy (SCV nodes were also included in the fields)  Pacemaker/ICD? No  Possible current pregnancy? N/A  Is the patient on methotrexate? No  Current Complaints / other details:   ONCOLOGIC HISTORY -05/05/2009; R lateral/flank lesion that looked like lipoma had been present ever since. S/p diagnostic excision, histopathologic details are unknown -Early 2013; rapid growth to a size of 13-cm of this exophytic mass. S/p excision in 07/30/12. 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  -08/14/12. benign-appearing subcentimeter scattered minimally FDG-avid cervical lymph nodes, a stable subcentimeter non-FDG-avid cutaneous lesion on the R cheek, stable subcentimeter mildly FDG-avid bilateral axillary lymph node, a stable 1-cm mildly FDG-avid nodule in the superior segment of the LLL. -08/28/12. Wide excision of the R flank melanoma. Negative for residual melanoma. -02/18/13. Restaging CT scan IV contrast. ill-defined 1.2-cm ground glass nodule in the superior segment L LL stable shotty bilateral axillary lymph nodules. The lesion is indeterminate but could represent an indolent primary lung cancer versus, less likely, metastatic disease. Referred to thorasic surgery in 03/02/13 and felt he should be observed for now. -12/27/15. growing R axillary lymphadenopathy. S/p core biopsy c/w metastatic melanoma. -01/23/16. R axillary lymph node dissection level 1-3. 1/24 lymph nodes showed metastatic melanoma (2.5-cm in dimension no TILs). -03/19/17. R axillary lymph node removal for growing lymph node. C/w metastatic melanoma, absent TILs. -5/4-6/29/2018. S/p 4 infusions of nivolumab. -07/04/17. Restaging PET/CT scan shows new R axillary growth and stable-to-growing lung nodules. -08/25/17. S/p wedge resection for growing lung nodules. C/w NSCLC adeno  type -06/26/18. Surveillance CT chest with slightly increasing RUL groundglass opacity -  08/14/18. R VATS with RUL wedge resection, adenocarcinoma pT1a, TPS 1%

## 2020-10-13 ENCOUNTER — Ambulatory Visit
Admission: RE | Admit: 2020-10-13 | Discharge: 2020-10-13 | Disposition: A | Discharge status: Home / Self Care | Payer: Medicare Other | Source: Ambulatory Visit | Attending: Radiation Oncology | Admitting: Radiation Oncology

## 2020-10-13 ENCOUNTER — Encounter: Payer: Self-pay | Admitting: Radiation Oncology

## 2020-10-13 DIAGNOSIS — C7951 Secondary malignant neoplasm of bone: Secondary | ICD-10-CM

## 2020-10-13 NOTE — Progress Notes (Signed)
Radiation Oncology         [phone] ________________________________  Re-Consultation Note by telephone.  The patient opted for telemedicine to maximize safety during the pandemic.  MyChart video was not obtainable.   Name: William Wilkerson MRN: [id number]  Date: 10/13/2020  DOB: 1951/09/15  QP:[id number], [name], MD  Aundra Millet, MD   REFERRING PHYSICIAN: Aundra Millet, MD  DIAGNOSIS:    ICD-10-CM   1. Malignant melanoma metastatic to bone [facility])  C79.51     Cancer Staging Cancer of axilla [facility]) Staging form: Melanoma of the Skin, AJCC 8th Edition - Clinical stage from 01/07/2018: Stage III (cT4b, cN2b, cM0) - Signed by Eppie Gibson, MD on 01/07/2018   CHIEF COMPLAINT: Here to discuss management of metastatic melanoma now with osseous metastases to the left iliac bone  HISTORY OF PRESENT ILLNESS::William Wilkerson is a 69 y.o. male who was treated by me for metastatic melanoma to the right axilla during early 2019. He was last seen in follow-up on 03/13/2018, during which time he was healing from radiotherapy and was to continue follow-up at [facility] and prn follow-up with me.  In the interim, the patient underwent a right surgical thoracoscopy with diagnostic wedge resection followed by anatomic lung resection on 08/14/2018 under the care of Dr. Waldemar Dickens. Pathology from the procedure revealed stage IA (pT1a, pNx) invasive adenocarcinoma. When compared to the patient's prior adenocarcinoma, given complete resection and small size of both tumors, they were felt to be two distinct primary lung cancers. As such, the patient was to continue on active surveillance with repeat staging scans every three months for continued monitoring of other known lung nodules.  Most recently, the patient underwent a CT scan of chest/abdomen/pelvis on the date of 09/08/2020 that showed multiple new lytic osseous metastases including left iliac bone large lytic lesion with soft tissue component extending to the  adjacent muscles. There was also a new 1.0 cm soft tissue nodule within th left upper quadrant that was concerning for omental implant. Additionally, there were multiple new scattered right lower lobe ground-glass nodules that were indeterminate. Finally, there were additional stable scattered sub-centimeter pulmonary nodules, subpleural reticular opacities, atelectasis in the left upper and lower lobes, a new small loculated right apical pleural effusion, and a small left pleural effusion.  CT-guided left iliac bone lesion biopsy was performed on 09/18/2020 under the care of Dr. Gwynne Edinger at [facility]. Results showed metastatic melanoma with a similar appearance to the previous metastatic melanoma involving the right axillary lymph nodes.  The patient was seen by Dr. Phoebe Sharps, radiation oncologist, on the date of 10/05/2020. At that time, it was recommended that he proceed with palliative 3DCRT (20 Gy in 5 fractions) to the left iliac lesion for pain control. Surgical intervention was not felt to be beneficial, however, the final decision was deferred to orthopedic oncology.  He has not seen an orthopedic surgeon, however;  Pt states that Dr. Phoebe Sharps cancelled the appt w/ orthopedic b/c surgery was determined not to be a good option; per patient, "it was out of the question."   Location(s) of Symptomatic Metastases:  Left hip   Pain on a scale of 0-10 is: Reports pain 5 out of 10 to left hip; sometimes it's more, sometimes less, but consistently a 5 out of 10   Bowel/Bladder retention or incontinence (please describe): Patient denies (though he does report his BMs alternate between constipation and diarrhea)  Numbness or weakness in extremities (please describe): Reports occasional numbness to the left  arm (he believes it's related to a disk/compression issue)  Current Decadron regimen, if applicable: N/A  Ambulatory status? Walker? Wheelchair?:  Patient reports he is able to ambulate  unassisted, but does admit that he would probably be more steady on his feet with an assistive device like a cane or walker  PREVIOUS RADIATION THERAPY: Yes  Diagnosis:   Metastatic melanoma to the right axilla     Indication for treatment:  Aggressive locoregional control      Radiation treatment dates:  01/19/18 - 02/13/18  Site/dose:   Right axilla treated to 48 Gy with 20 fx of 2.4 Gy (SCV nodes were also included in the fields)  Beams/energy:  3D / 6X, 15X  PAST MEDICAL HISTORY:  has a past medical history of Cancer ([place]) (2008), Genetic testing (07/04/2017), History of radiation therapy (01/19/18- 02/13/18), Hypertension, Lung cancer ([place]) (07/2017), and Melanoma ([place]) (2013).    PAST SURGICAL HISTORY: Past Surgical History:  Procedure Laterality Date  . IR FLUORO GUIDE PORT INSERTION RIGHT  05/27/2017  . IR US GUIDE VASC ACCESS RIGHT  05/27/2017  . MELANOMA EXCISION Right    x4-abd & axillary  . PROSTATE SURGERY  2008   cancer  . ROTATOR CUFF REPAIR Left 1988  . THORACOSCOPY Left 08/25/2017   Thoracoscopy with Lobectomy Mediastinal and Regional Lymphadenectomy at [facility] by Dr. Waldemar Dickens    FAMILY HISTORY: family history includes Breast cancer in his sister; Colon cancer in his brother and brother; Lung cancer in his brother and sister; Ovarian cancer in an other family member; Ovarian cancer (age of onset: 42) in his mother; Prostate cancer in his brother.  SOCIAL HISTORY:  reports that he quit smoking about 36 years ago. He has a 5.00 pack-year smoking history. He has never used smokeless tobacco. He reports current alcohol use of about 1.0 standard drink of alcohol per week. He reports that he does not use drugs.  ALLERGIES: Codeine, Hydromorphone, Morphine, and Percocet [oxycodone-acetaminophen]  MEDICATIONS:  Current Outpatient Medications  Medication Sig Dispense Refill  . amLODipine (NORVASC) 10 MG tablet Take 10 mg by mouth daily.    . DULoxetine (CYMBALTA) 20  MG capsule Take 20 mg by mouth daily.    Marland Kitchen ibuprofen (ADVIL,MOTRIN) 800 MG tablet Take 800 mg by mouth every 8 (eight) hours as needed for mild pain.    . indomethacin (INDOCIN) 50 MG capsule Take 50 mg by mouth 3 (three) times daily as needed.    Marland Kitchen losartan (COZAAR) 100 MG tablet Take 100 mg by mouth daily.    . pantoprazole (PROTONIX) 40 MG tablet Take 40 mg by mouth daily.    Marland Kitchen zolpidem (AMBIEN CR) 6.25 MG CR tablet Take 6.25 mg by mouth at bedtime as needed for sleep.    . traMADol (ULTRAM) 50 MG tablet Take 50 mg by mouth every 6 (six) hours as needed. (Patient not taking: Reported on 10/13/2020)     No current facility-administered medications for this encounter.    REVIEW OF SYSTEMS:  Notable for that above.   PHYSICAL EXAM:  vitals were not taken for this visit.   General: Alert and oriented, in no acute distress  Psychiatric: Judgment and insight are intact. Affect is appropriate.   LABORATORY DATA:  Lab Results  Component Value Date   WBC 4.8 06/27/2017   HGB 12.5 (L) 06/27/2017   HCT 35.7 (L) 06/27/2017   MCV 88.3 06/27/2017   PLT 361 06/27/2017   CMP     Component Value  Date/Time   NA 140 06/27/2017 1307   K 3.5 06/27/2017 1307   CO2 27 06/27/2017 1307   GLUCOSE 88 06/27/2017 1307   BUN 9.6 06/27/2017 1307   CREATININE 1.3 06/27/2017 1307   CALCIUM 9.1 06/27/2017 1307   PROT 7.3 06/27/2017 1307   ALBUMIN 4.1 06/27/2017 1307   AST 21 06/27/2017 1307   ALT 17 06/27/2017 1307   ALKPHOS 61 06/27/2017 1307   BILITOT 0.98 06/27/2017 1307      09-08-20 [facility] labs in Care Everywhere: BUN 11, Cr 1.26;  EGFR Afr Am Male: 65   RADIOGRAPHY:  As above     IMPRESSION/PLAN: Metastatic melanoma now with osseous metastases to the left iliac bone   I have access to his reports and currently working on obtaining the radiology images from [facility].  Today, I talked to the patient about the findings and work-up thus far. We discussed the patient's diagnosis of metastatic  melanoma to the left pelvic bone/adjacent musculature and general treatment for this, highlighting the role of radiotherapy in the management. We discussed the available radiation techniques, and focused on the details of logistics and delivery.   I recommend a 5 treatment regimen to a total dose of 20 Gy using 3D conformal radiotherapy with palliative intent.  He is having significant pain and is looking forward to an opportunity to improve that pain.  No guarantees of treatment were given. We discussed the risks, benefits, and side effects of radiotherapy. Side effects may include but not necessarily be limited to: Skin irritation, fatigue, nausea, diarrhea, bladder irritation, rare internal organ injury; no guarantees of treatment were given. The patient was encouraged to ask questions that I answered to the best of my ability.   We will arrange CT simulation in the near future and work on obtaining his outside imaging.  The patient is pleased with this plan and I look forward to seeing him again in person.  On date of service, in total, I spent 28 minutes on this encounter.   This encounter was provided by telemedicine platform by telephone.  The patient opted for telemedicine to maximize safety during the pandemic.  MyChart video was not obtainable. The patient has given verbal consent for this type of encounter and has been advised to only accept a meeting of this type in a secure network environment. The attendants for this meeting include Eppie Gibson  and Toma Copier.  During the encounter, Eppie Gibson was located at [facility] Radiation Oncology Department.  William Wilkerson was located at home.    __________________________________________   Eppie Gibson, MD  This document serves as a record of services personally performed by Eppie Gibson, MD. It was created on his behalf by Clerance Lav, a trained medical scribe. The creation of this record is based on the scribe's personal  observations and the provider's statements to them. This document has been checked and approved by the attending provider.

## 2020-10-16 ENCOUNTER — Ambulatory Visit
Admission: RE | Admit: 2020-10-16 | Discharge: 2020-10-16 | Disposition: A | Discharge status: Home / Self Care | Payer: Self-pay | Source: Ambulatory Visit | Attending: Radiation Oncology | Admitting: Radiation Oncology

## 2020-10-16 ENCOUNTER — Other Ambulatory Visit: Payer: Self-pay | Admitting: Radiation Oncology

## 2020-10-16 DIAGNOSIS — C7951 Secondary malignant neoplasm of bone: Secondary | ICD-10-CM

## 2020-10-17 ENCOUNTER — Ambulatory Visit
Admission: RE | Admit: 2020-10-17 | Discharge: 2020-10-17 | Disposition: A | Discharge status: Home / Self Care | Payer: Medicare Other | Source: Ambulatory Visit | Attending: Radiation Oncology | Admitting: Radiation Oncology

## 2020-10-17 ENCOUNTER — Other Ambulatory Visit: Payer: Self-pay

## 2020-10-17 ENCOUNTER — Other Ambulatory Visit: Payer: Self-pay | Admitting: Radiation Oncology

## 2020-10-17 ENCOUNTER — Ambulatory Visit: Payer: Medicare Other

## 2020-10-17 ENCOUNTER — Ambulatory Visit
Admission: RE | Admit: 2020-10-17 | Discharge: 2020-10-17 | Disposition: A | Discharge status: Home / Self Care | Payer: Self-pay | Source: Ambulatory Visit | Attending: Radiation Oncology | Admitting: Radiation Oncology

## 2020-10-17 DIAGNOSIS — Z510 Encounter for antineoplastic radiation therapy: Secondary | ICD-10-CM | POA: Diagnosis not present

## 2020-10-17 DIAGNOSIS — C7951 Secondary malignant neoplasm of bone: Secondary | ICD-10-CM | POA: Insufficient documentation

## 2020-10-17 DIAGNOSIS — C439 Malignant melanoma of skin, unspecified: Secondary | ICD-10-CM

## 2020-10-20 DIAGNOSIS — Z510 Encounter for antineoplastic radiation therapy: Secondary | ICD-10-CM | POA: Diagnosis not present

## 2020-10-23 ENCOUNTER — Ambulatory Visit
Admission: RE | Admit: 2020-10-23 | Discharge: 2020-10-23 | Disposition: A | Discharge status: Home / Self Care | Payer: Medicare Other | Source: Ambulatory Visit | Attending: Radiation Oncology | Admitting: Radiation Oncology

## 2020-10-23 DIAGNOSIS — Z510 Encounter for antineoplastic radiation therapy: Secondary | ICD-10-CM | POA: Diagnosis not present

## 2020-10-24 ENCOUNTER — Ambulatory Visit
Admission: RE | Admit: 2020-10-24 | Discharge: 2020-10-24 | Disposition: A | Discharge status: Home / Self Care | Payer: Medicare Other | Source: Ambulatory Visit | Attending: Radiation Oncology | Admitting: Radiation Oncology

## 2020-10-24 DIAGNOSIS — Z510 Encounter for antineoplastic radiation therapy: Secondary | ICD-10-CM | POA: Diagnosis not present

## 2020-10-25 ENCOUNTER — Ambulatory Visit
Admission: RE | Admit: 2020-10-25 | Discharge: 2020-10-25 | Disposition: A | Discharge status: Home / Self Care | Payer: Medicare Other | Source: Ambulatory Visit | Attending: Radiation Oncology | Admitting: Radiation Oncology

## 2020-10-25 DIAGNOSIS — Z510 Encounter for antineoplastic radiation therapy: Secondary | ICD-10-CM | POA: Diagnosis not present

## 2020-10-26 ENCOUNTER — Ambulatory Visit
Admission: RE | Admit: 2020-10-26 | Discharge: 2020-10-26 | Disposition: A | Discharge status: Home / Self Care | Payer: Medicare Other | Source: Ambulatory Visit | Attending: Radiation Oncology | Admitting: Radiation Oncology

## 2020-10-26 DIAGNOSIS — Z510 Encounter for antineoplastic radiation therapy: Secondary | ICD-10-CM | POA: Diagnosis not present

## 2020-10-27 ENCOUNTER — Ambulatory Visit
Admission: RE | Admit: 2020-10-27 | Discharge: 2020-10-27 | Disposition: A | Discharge status: Home / Self Care | Payer: Medicare Other | Source: Ambulatory Visit | Attending: Radiation Oncology | Admitting: Radiation Oncology

## 2020-10-27 ENCOUNTER — Encounter: Payer: Self-pay | Admitting: Radiation Oncology

## 2020-10-27 DIAGNOSIS — Z510 Encounter for antineoplastic radiation therapy: Secondary | ICD-10-CM | POA: Diagnosis not present

## 2020-11-28 ENCOUNTER — Telehealth: Payer: Self-pay | Admitting: *Deleted

## 2020-11-28 NOTE — Telephone Encounter (Signed)
Returned patient's phone call, spoke with patient 

## 2020-12-01 ENCOUNTER — Telehealth: Payer: Self-pay | Admitting: *Deleted

## 2020-12-01 ENCOUNTER — Other Ambulatory Visit: Payer: Self-pay

## 2020-12-01 ENCOUNTER — Ambulatory Visit
Admission: RE | Admit: 2020-12-01 | Discharge: 2020-12-01 | Disposition: A | Discharge status: Home / Self Care | Payer: Medicare Other | Source: Ambulatory Visit | Attending: Radiation Oncology | Admitting: Radiation Oncology

## 2020-12-01 ENCOUNTER — Other Ambulatory Visit: Payer: Self-pay | Admitting: Radiation Therapy

## 2020-12-01 DIAGNOSIS — C7951 Secondary malignant neoplasm of bone: Secondary | ICD-10-CM

## 2020-12-01 DIAGNOSIS — C439 Malignant melanoma of skin, unspecified: Secondary | ICD-10-CM

## 2020-12-01 MED ORDER — DEXAMETHASONE 4 MG PO TABS: ORAL_TABLET | ORAL | 0 refills | Status: DC

## 2020-12-01 MED ORDER — ONDANSETRON 8 MG PO TBDP: 8.0000 mg | ORAL_TABLET | Freq: Three times a day (TID) | ORAL | 3 refills | Status: DC | PRN

## 2020-12-01 NOTE — Telephone Encounter (Signed)
CALLED PATIENT TO INFORM OF APPT. WITH DR. STERGIOS MOSCHOS OF [facility], FOR 12-15-20 - ARRIVAL TIME- 3:30 PM, SPOKE WITH PATIENT AND HE IS AWARE OF THIS APPT.

## 2020-12-01 NOTE — Progress Notes (Signed)
Radiation Oncology         [phone] ________________________________  Name: William Wilkerson MRN: [id number]  Date: 12/01/2020  DOB: 08/12/1951  Follow-Up Visit Note by telephone.  The patient opted for telemedicine to maximize safety during the pandemic.  MyChart video was not obtainable.   Outpatient  CC: [name], MD  [name], MD  Diagnosis and Prior Radiotherapy: C79.51   ICD-10-CM   1. Melanoma of skin (HCC)  C43.9 dexamethasone (DECADRON) 4 MG tablet    ondansetron (ZOFRAN ODT) 8 MG disintegrating tablet  2. Bone metastases (HCC)  C79.51 dexamethasone (DECADRON) 4 MG tablet    ondansetron (ZOFRAN ODT) 8 MG disintegrating tablet    CHIEF COMPLAINT: Here for follow-up and surveillance of melanoma, metastatic  Narrative:  The patient returns today for routine follow-up.  On October 27, 2020 he received his final fraction to his left ilium with palliative intent.  He received a total dose of 20 Gray in 5 fractions.  He tolerated this well.   At the time, he was having left hip pain.  His left hip pain has resolved.  Unfortunately, he is now having severe low back pain.  It radiates down his left leg.  It radiates to both sides of his lower back.  He has constipation which is consistent with his baseline but no new bowel or bladder symptoms.  He is ambulatory.  He denies numbness in his legs.  He denies motor deficits in his legs.  His wife is present for the call and is very concerned about him.   He did have asymptomatic sacral metastases on imaging when I saw him a month ago.   He is asking for medication to help with his pain.  He also has some nausea and would like some medication for that.  He does not have any follow-up at [facility] with his medical oncologist yet.  He and his wife are interested in systemic imaging to see the status of his disease overall.                   ALLERGIES:  is allergic to codeine, hydromorphone, morphine, and percocet  [oxycodone-acetaminophen].  Meds: Current Outpatient Medications  Medication Sig Dispense Refill  . amLODipine (NORVASC) 10 MG tablet Take 10 mg by mouth daily.    Marland Kitchen dexamethasone (DECADRON) 4 MG tablet Take 2 tablets daily by mouth. Take with food. 40 tablet 0  . DULoxetine (CYMBALTA) 20 MG capsule Take 20 mg by mouth daily.    Marland Kitchen ibuprofen (ADVIL,MOTRIN) 800 MG tablet Take 800 mg by mouth every 8 (eight) hours as needed for mild pain.    . indomethacin (INDOCIN) 50 MG capsule Take 50 mg by mouth 3 (three) times daily as needed.    Marland Kitchen losartan (COZAAR) 100 MG tablet Take 100 mg by mouth daily.    . ondansetron (ZOFRAN ODT) 8 MG disintegrating tablet Take 1 tablet (8 mg total) by mouth every 8 (eight) hours as needed for nausea or vomiting. 30 tablet 3  . pantoprazole (PROTONIX) 40 MG tablet Take 40 mg by mouth daily.    . traMADol (ULTRAM) 50 MG tablet Take 50 mg by mouth every 6 (six) hours as needed. (Patient not taking: Reported on 10/13/2020)    . zolpidem (AMBIEN CR) 6.25 MG CR tablet Take 6.25 mg by mouth at bedtime as needed for sleep.     No current facility-administered medications for this encounter.    Physical Findings: The patient is in  no acute distress. Patient is alert and oriented.  vitals were not taken for this visit. .      Lab Findings: Lab Results  Component Value Date   WBC 4.8 06/27/2017   HGB 12.5 (L) 06/27/2017   HCT 35.7 (L) 06/27/2017   MCV 88.3 06/27/2017   PLT 361 06/27/2017   CMP     Component Value Date/Time   NA 140 06/27/2017 1307   K 3.5 06/27/2017 1307   CO2 27 06/27/2017 1307   GLUCOSE 88 06/27/2017 1307   BUN 9.6 06/27/2017 1307   CREATININE 1.3 06/27/2017 1307   CALCIUM 9.1 06/27/2017 1307   PROT 7.3 06/27/2017 1307   ALBUMIN 4.1 06/27/2017 1307   AST 21 06/27/2017 1307   ALT 17 06/27/2017 1307   ALKPHOS 61 06/27/2017 1307   BILITOT 0.98 06/27/2017 1307    Radiographic Findings: No new imaging  Impression/Plan: I suspect he  has progressive metastases in his sacral plus or minus lumbar spine.  We will order an MRI of his lumbosacral spine, review the results, and likely schedule him for further radiation therapy with palliative intent.  We will arrange f/u and simulation with me after scans.   He has nausea (not sure why).  We will refer him back to his med/onc at [facility], and let them know I've started him on Dexamethasone for the back pain and we are working that up w/ MRIs. I also started Zofran for nausea.  We will let med /onc know that the patient and his wife are interested in systemic imaging, labs, etc.  Patient and his wife are pleased with this plan.  We will be in touch with him once appointments get scheduled ASAP.  This encounter was provided by telemedicine platform; patient desired telemedicine during pandemic precautions.  MyChart video was not available so telephone was used. The patient has given verbal consent for this type of encounter and has been advised to only accept a meeting of this type in a secure network environment. On date of service, in total, I spent 30 minutes on this encounter, including speaking with the patient and his wife and coordinating care thereafter.   The attendants for this meeting include Eppie Gibson  and Toma Copier During the encounter, Eppie Gibson was located at [facility] Radiation Oncology Department.  Sequoyah Counterman was located at home.   _____________________________________   Eppie Gibson, MD

## 2020-12-05 ENCOUNTER — Encounter: Payer: Self-pay | Admitting: Radiation Oncology

## 2020-12-05 DIAGNOSIS — C7951 Secondary malignant neoplasm of bone: Secondary | ICD-10-CM | POA: Insufficient documentation

## 2020-12-08 ENCOUNTER — Ambulatory Visit
Admission: RE | Admit: 2020-12-08 | Discharge: 2020-12-08 | Disposition: A | Discharge status: Home / Self Care | Payer: BLUE CROSS/BLUE SHIELD | Source: Ambulatory Visit | Attending: Radiation Oncology | Admitting: Radiation Oncology

## 2020-12-08 ENCOUNTER — Other Ambulatory Visit: Payer: Self-pay

## 2020-12-08 DIAGNOSIS — C7951 Secondary malignant neoplasm of bone: Secondary | ICD-10-CM

## 2020-12-08 IMAGING — MR MR LUMBAR SPINE WO/W CM
4 of 10 series · 20 of 48 positions shown · IV contrast (multihance)
Comparison: CT [DATE], [DATE]

CLINICAL DATA: Low back pain radiating to the left hip for 3
months. History of lung cancer, prostate cancer, and melanoma. Known
metastatic bone lesion in the left ilium.

EXAM:
MRI LUMBAR SPINE WITHOUT AND WITH CONTRAST
TECHNIQUE: Multiplanar and multiecho pulse sequences of the lumbar spine were
obtained without and with intravenous contrast.
CONTRAST:  17mL MULTIHANCE GADOBENATE DIMEGLUMINE 529 MG/ML IV SOLN

[Series 14: T1 · sagittal · 4.0mm · 1.09mm/px · 4 of 19 slices shown (1 of 2)]
[im 1/19]
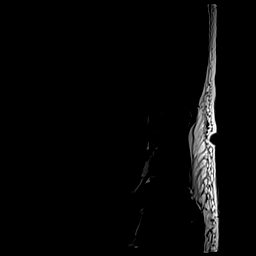
[im 7/19]
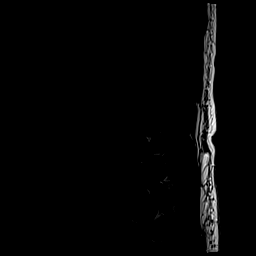
[im 13/19]
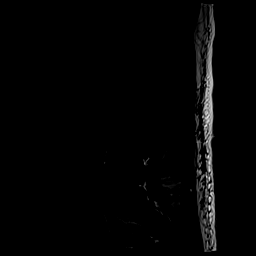
[im 19/19]
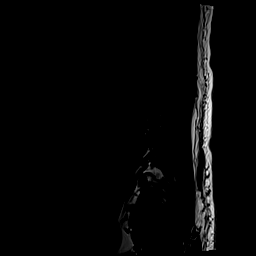

[Series 17: T2 · axial · 4.0mm · 0.28mm/px · z∈[-84,+120]mm · 9 of 42 slices shown (1 of 2)]
[im 1/42]
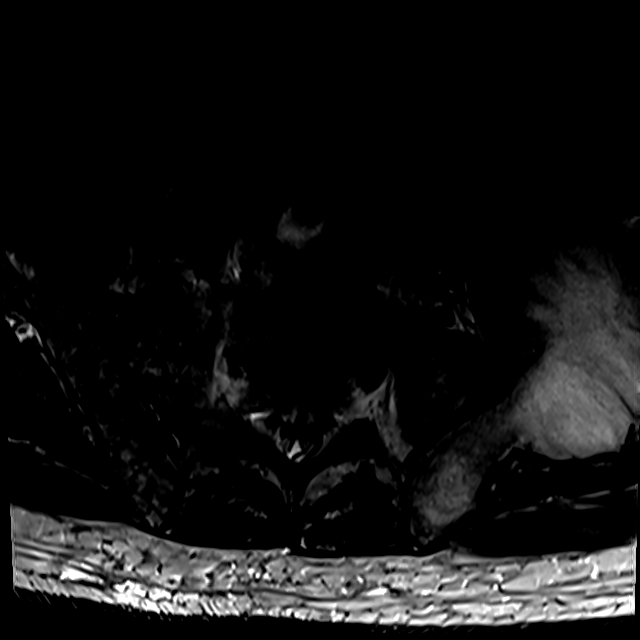
[im 6/42]
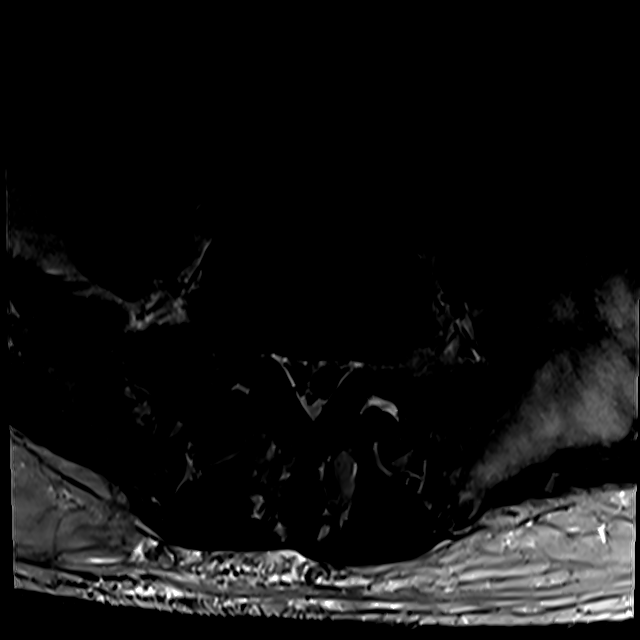
[im 11/42]
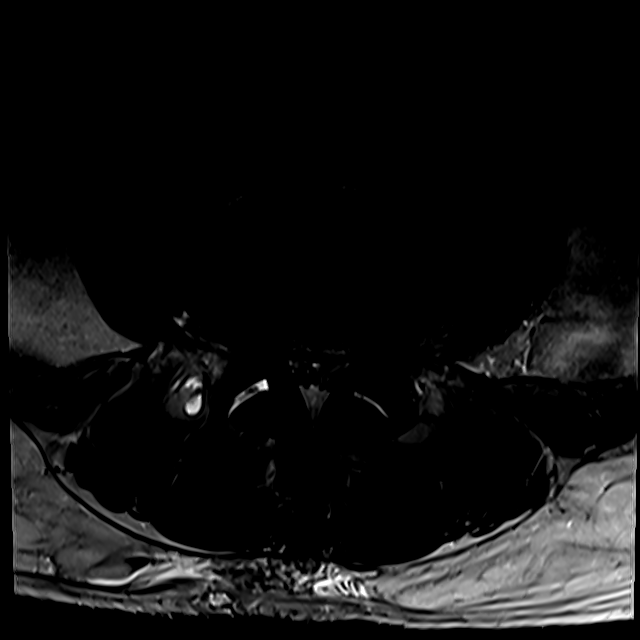
[im 16/42]
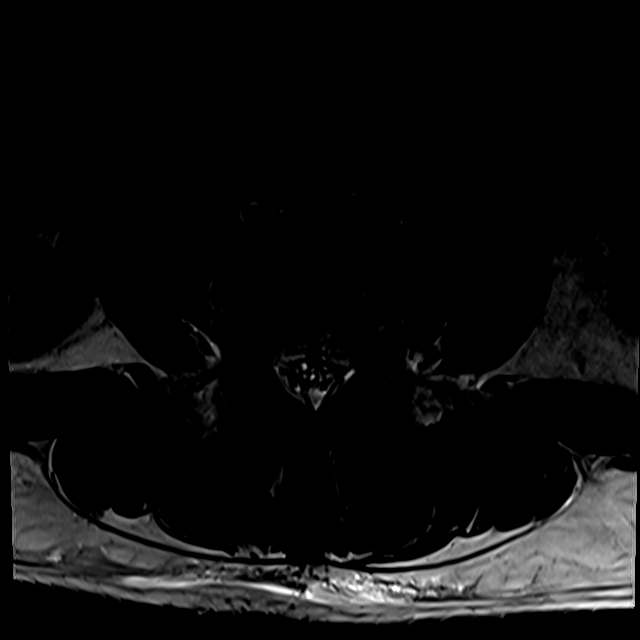
[im 21/42]
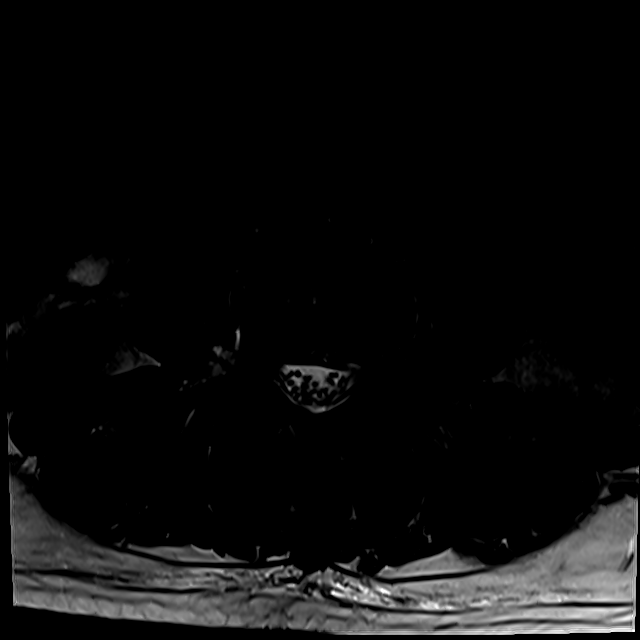
[im 26/42]
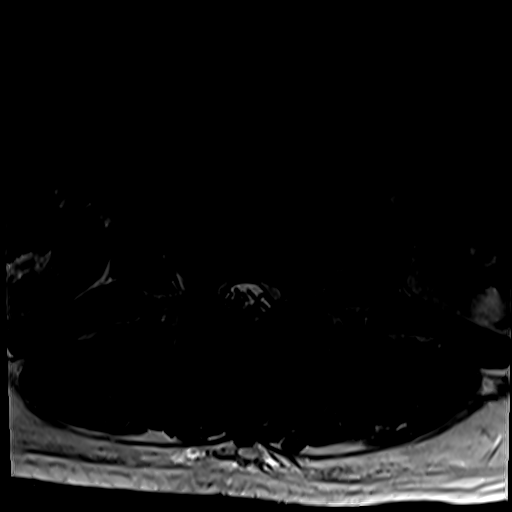
[im 31/42]
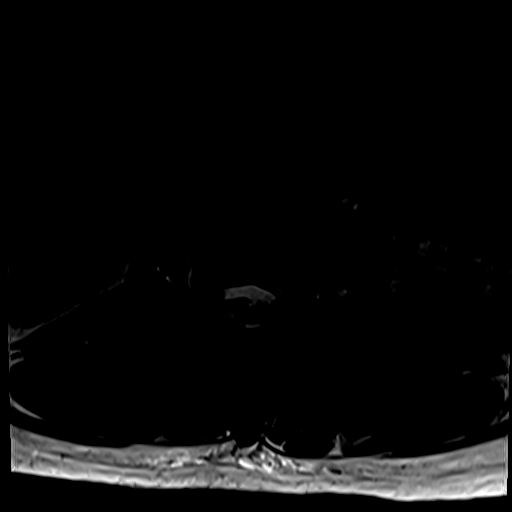
[im 36/42]
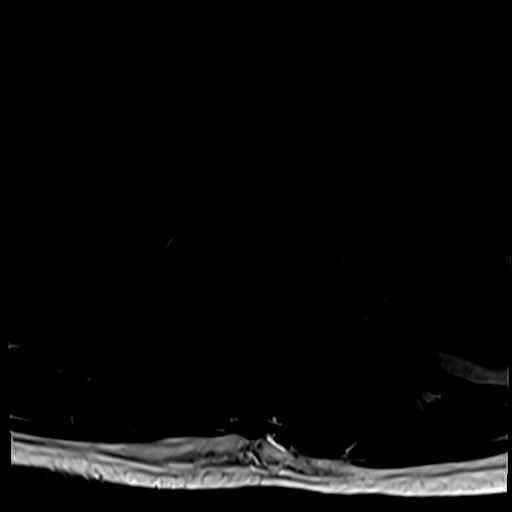
[im 42/42]
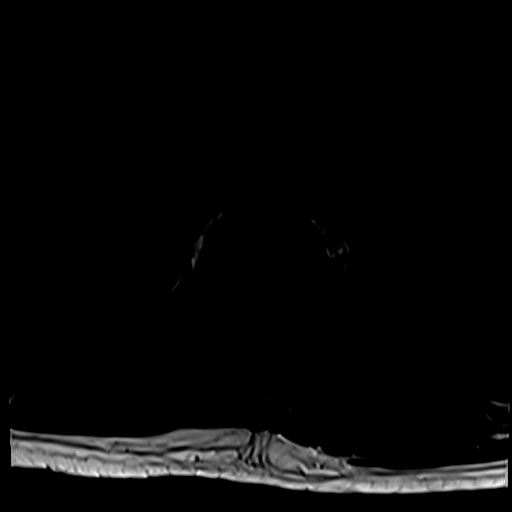

[Series 18: T1 · axial · 4.0mm · 0.35mm/px · z∈[+30,+120]mm · 3 of 19 slices shown (2 of 2)]
[im 1/19]
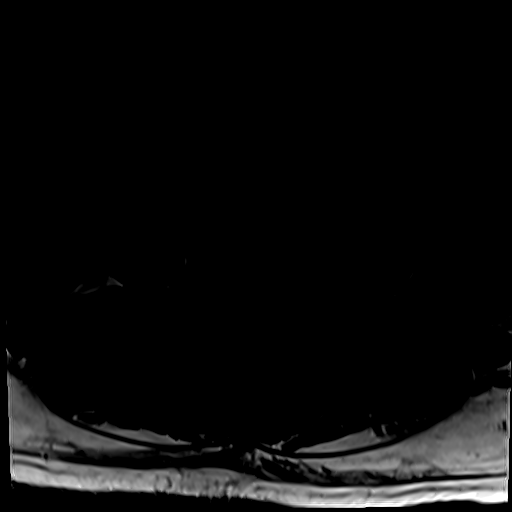
[im 13/19]
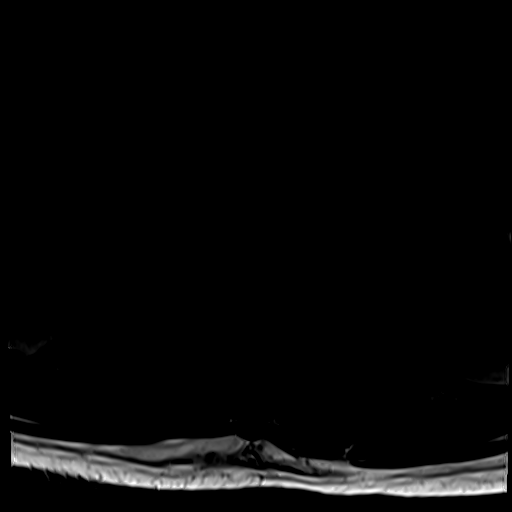
[im 19/19]
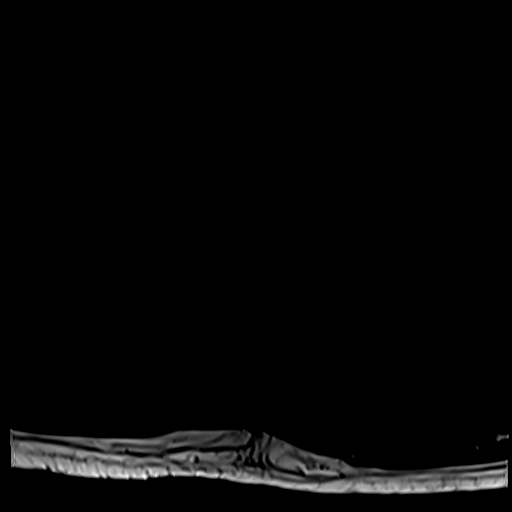

[Series 20: T2 · sagittal · 4.0mm · 0.88mm/px · 4 of 19 slices shown (2 of 2)]
[im 1/19]
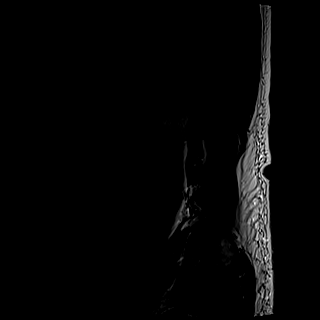
[im 7/19]
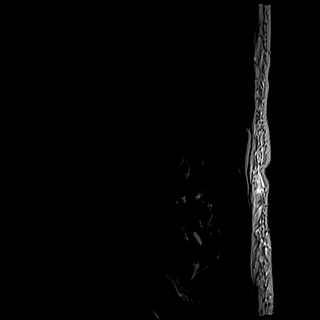
[im 13/19]
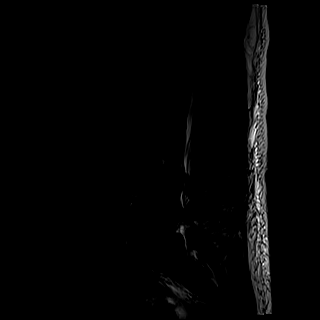
[im 19/19]
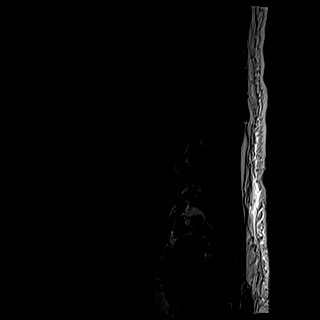

[20 of 48 positions shown; findings below may reference images not displayed]

FINDINGS: Segmentation:  Standard.

Alignment:  Trace retrolisthesis L4 on L5.

Vertebrae: There is diffusely decreased T1 marrow signal throughout
the visualized osseous structures of the spine and bony pelvis.
Heterogeneous postcontrast enhancement of the marrow structures.
Partially visualized large expansile marrow replacing bone lesion
within the left ilium measuring at least 6.3 cm in AP dimension
(series 17, image 42). Superior endplate compression fracture of L1
with minimal residual edema along the fracture margin (series 20,
image 10). Approximately 20% vertebral body height loss and trace
retropulsion of the posterior wall of the vertebral body. No
additional fractures. No evidence of discitis.

Conus medullaris and cauda equina: Conus extends to the L2 level.
Conus and cauda equina appear normal.

Paraspinal and other soft tissues: Negative.

Disc levels:

T12-L1: No significant disc protrusion, foraminal stenosis, or canal
stenosis.

L1-L2: No significant disc protrusion, foraminal stenosis, or canal
stenosis.

L2-L3: No significant disc protrusion, foraminal stenosis, or canal
stenosis.

L3-L4: Disc height loss with mild circumferential disc bulge. There
is ligamentum flavum buckling. Findings result in mild canal
stenosis and mild bilateral foraminal stenosis.

L4-L5: Advanced disc height loss with disc bulge and endplate
spurring. Mild bilateral facet hypertrophy. Bilateral facet joint
effusions. Ligamentum flavum buckling. Findings contribute to mild
canal stenosis with moderate bilateral foraminal stenosis.

L5-S1: Disc height loss with mild circumferential disc bulge and
endplate spurring. Bilateral facet hypertrophy with bilateral facet
joint effusions. Ligamentum flavum buckling. Moderate right worse
than left foraminal stenosis. No significant canal stenosis.
IMPRESSION: 1. Subacute-to-chronic appearing superior endplate compression
fracture of L1 with minimal residual edema along the fracture
margin. Approximately 20% vertebral body height loss and trace
retropulsion of the posterior wall of the vertebral body. No
associated canal stenosis or cord impingement.
2. Lumbar spondylosis most pronounced at the L4-5 and L5-S1 levels
where there is mild canal stenosis and moderate bilateral foraminal
stenosis.
3. Diffusely infiltrated appearance of the visualized marrow
structures of the spine and pelvis suggesting diffuse osseous
metastatic disease.
4. Partially visualized large expansile marrow replacing bone lesion
within the left ilium measuring at least 6.3 cm in AP dimension.
Please refer to pathology results from percutaneous biopsy of this
lesion from [DATE].

## 2020-12-08 IMAGING — MR MR SACRUM / SI JOINTS WO/W CM
10 series · 48 of 48 positions shown · IV contrast (multihance)
Comparison: CT [DATE], [DATE]

CLINICAL DATA: Severe low back pain. History of lung cancer,
prostate cancer, and melanoma

EXAM:
MRI SACRUM WITHOUT AND WITH CONTRAST
TECHNIQUE: Multiplanar and multiecho pulse sequences of the sacrum were
obtained without and with intravenous contrast.
CONTRAST:  17mL MULTIHANCE GADOBENATE DIMEGLUMINE 529 MG/ML IV SOLN

[Series 2: T1 · axial · 4.0mm · 0.62mm/px · z∈[-217,-76]mm · 5 of 30 slices shown (1 of 2)]
[im 1/30]
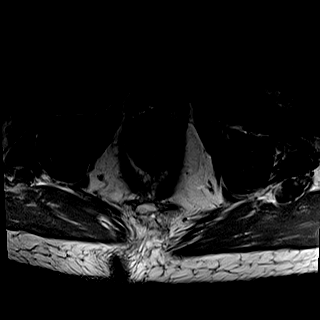
[im 8/30]
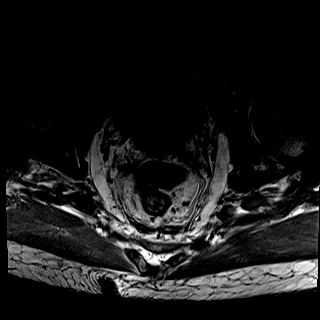
[im 15/30]
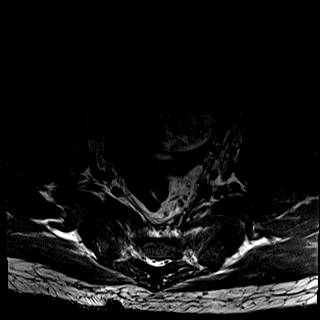
[im 22/30]
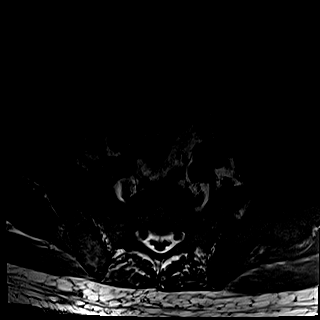
[im 30/30]
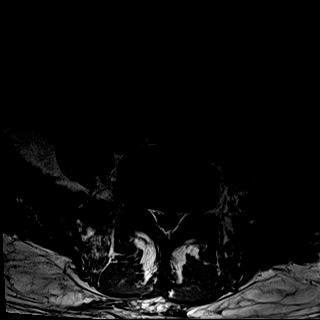

[Series 3: T2 fat-sat · axial · 4.0mm · 0.62mm/px · z∈[-217,-76]mm · 4 of 30 slices shown (1 of 2)]
[im 1/30]
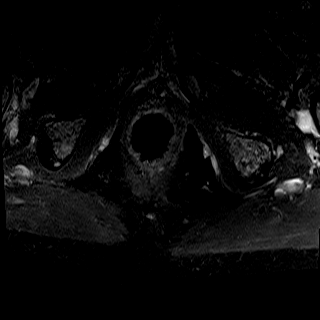
[im 10/30]
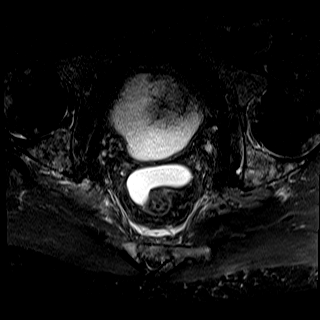
[im 20/30]
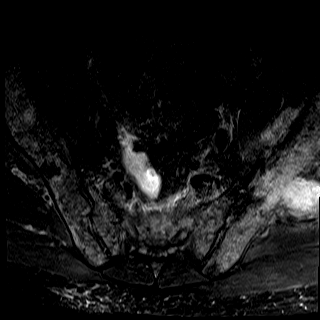
[im 30/30]
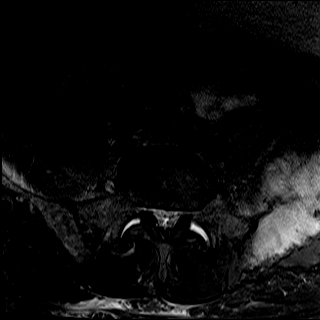

[Series 4: T2 fat-sat · sagittal · 4.0mm · 0.94mm/px · 4 of 32 slices shown (2 of 2)]
[im 1/32]
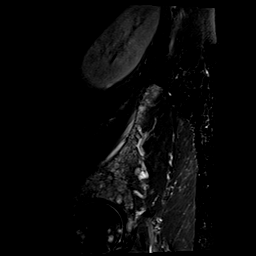
[im 11/32]
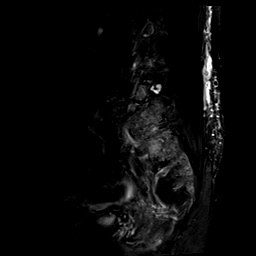
[im 21/32]
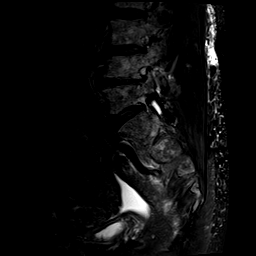
[im 32/32]
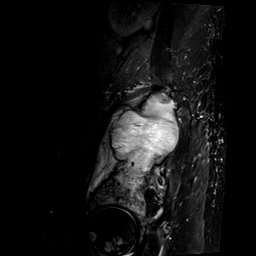

[Series 5: T1 fat-sat · axial · 4.0mm · 0.62mm/px · z∈[-217,-76]mm · 4 of 30 slices shown (1 of 4)]
[im 1/30]
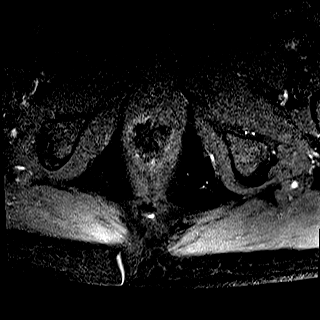
[im 10/30]
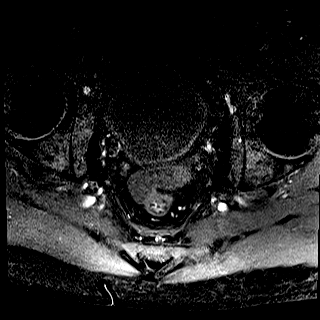
[im 20/30]
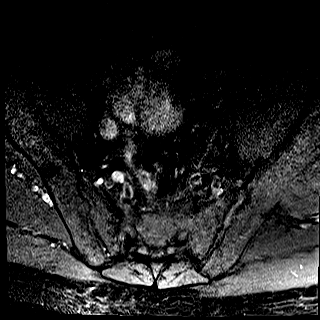
[im 30/30]
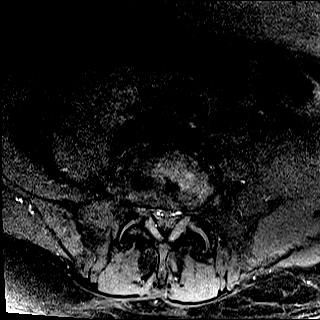

[Series 6: T1 · coronal · 4.0mm · 0.56mm/px · 5 of 37 slices shown (2 of 2)]
[im 1/37]
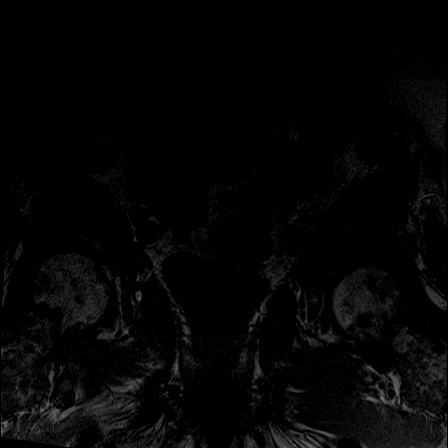
[im 10/37]
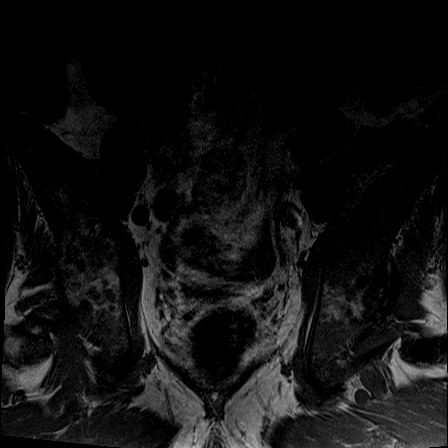
[im 19/37]
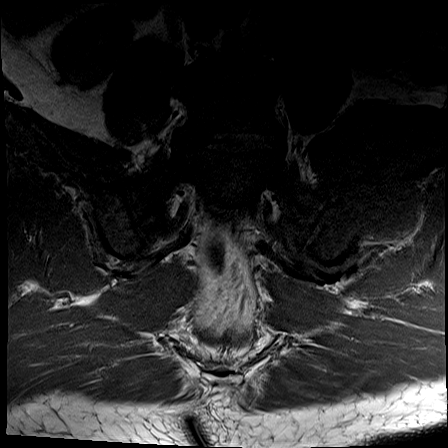
[im 28/37]
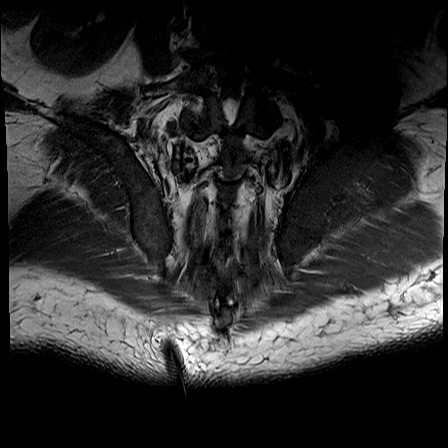
[im 37/37]
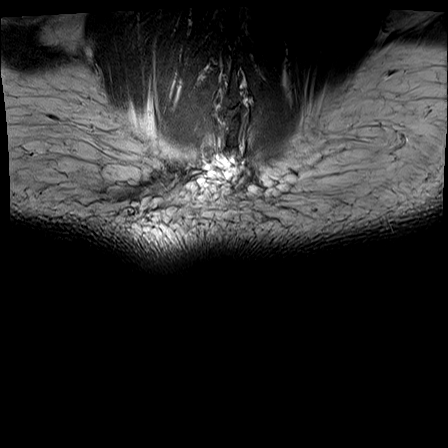

[Series 7: T1 fat-sat · axial · 4.0mm · 0.78mm/px · z∈[-226,-36]mm · 6 of 40 slices shown (2 of 4)]
[im 1/40]
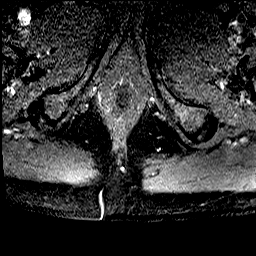
[im 8/40]
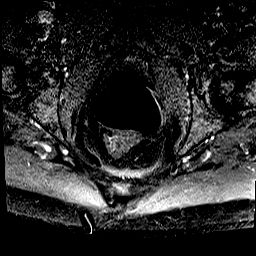
[im 16/40]
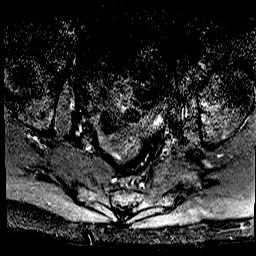
[im 24/40]
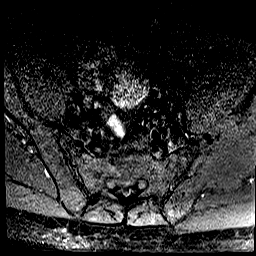
[im 32/40]
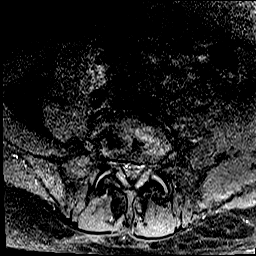
[im 40/40]
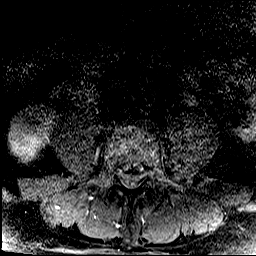

[Series 8: STIR · coronal · 4.0mm · 1.09mm/px · 5 of 37 slices shown]
[im 1/37]
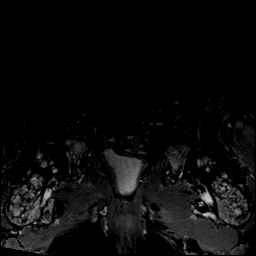
[im 10/37]
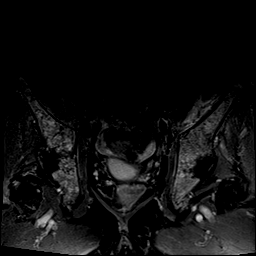
[im 19/37]
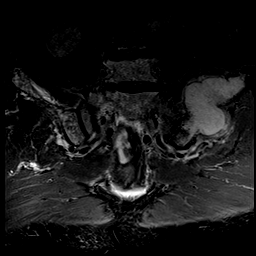
[im 28/37]
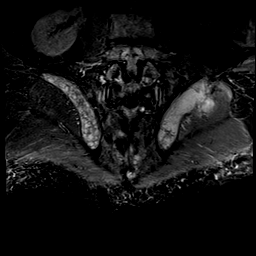
[im 37/37]
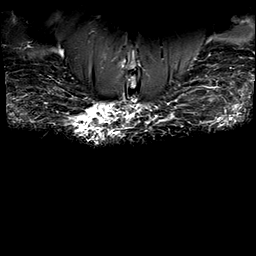

[Series 9: T1 fat-sat · axial · 4.0mm · 0.78mm/px · z∈[-225,-35]mm · 6 of 40 slices shown (3 of 4)]
[im 1/40]
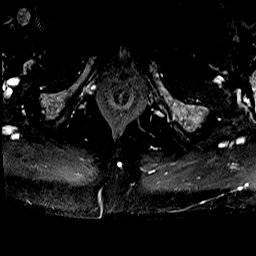
[im 8/40]
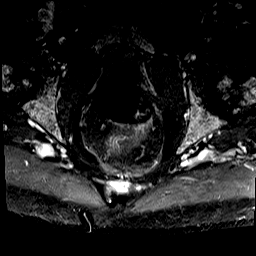
[im 16/40]
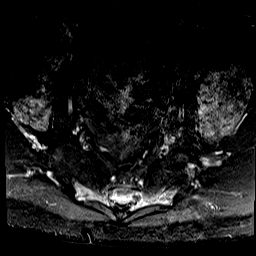
[im 24/40]
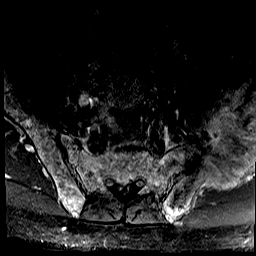
[im 32/40]
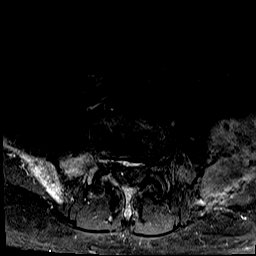
[im 40/40]
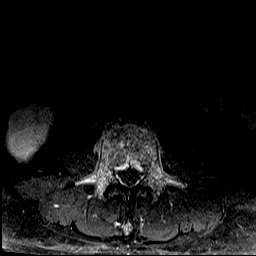

[Series 10: T1 fat-sat · coronal · 4.0mm · 0.78mm/px · 5 of 37 slices shown (4 of 4)]
[im 1/37]
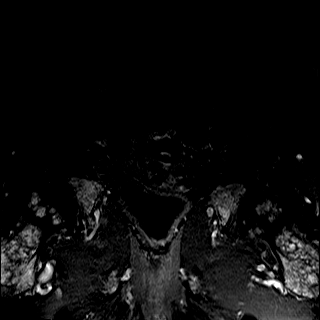
[im 10/37]
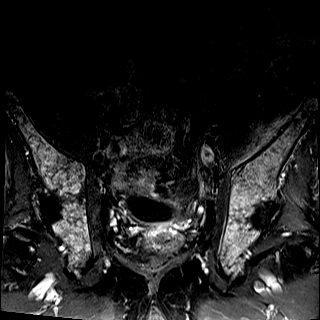
[im 19/37]
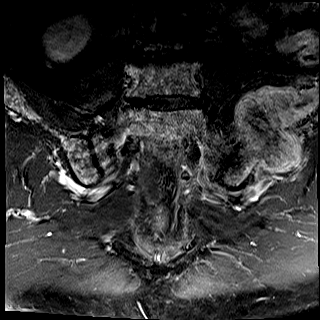
[im 28/37]
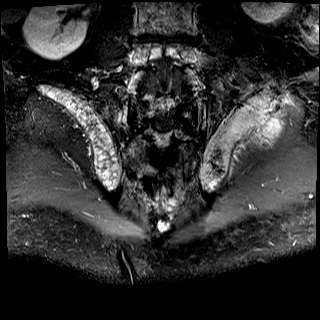
[im 37/37]
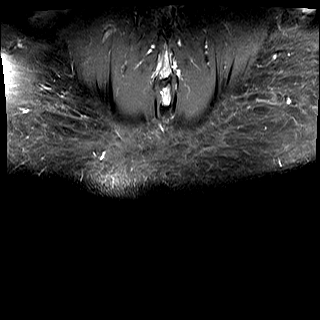

[Series 11: T1 fat-sat post-contrast · sagittal · 4.0mm · 0.94mm/px · 4 of 32 slices shown]
[im 1/32]
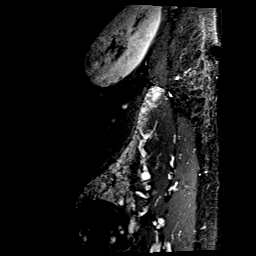
[im 11/32]
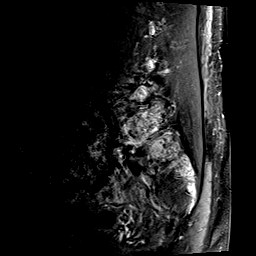
[im 21/32]
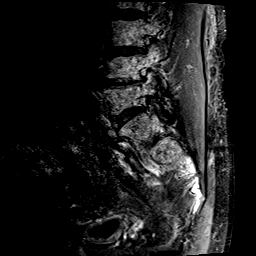
[im 32/32]
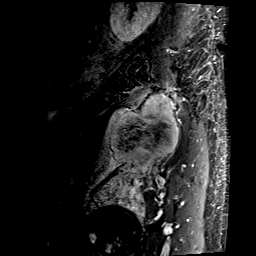

[48 of 48 positions shown; findings below may reference images not displayed]

FINDINGS: Bones/Joint/Cartilage

Widespread marrow infiltration of the visualized lower lumbar spine,
pelvis, and proximal bilateral femurs. Partially visualized large
expansile mass within the posterior aspect of the left ilium
measuring at least 8.0 x 6.3 cm (series 8, image 18; series 3, image
5). There is cortical expansion/breakthrough with associated
extraosseous soft tissue mass and likely infiltration of the
adjacent iliacus and gluteus minimus musculature. No pathologic
fractures are identified. Degenerative disc disease within the
visualized lower lumbar spine, as characterized on concurrently
obtained lumbar spine exam.

Ligaments

Grossly intact.

Muscles and Tendons

Suspected tumor infiltration within the left iliacus and left
gluteus minimus musculature.

Soft tissues

Small amount of free fluid within the pelvis, nonspecific. Colonic
diverticulosis.
IMPRESSION: 1. Widespread marrow infiltration of the visualized lower lumbar
spine, pelvis, and proximal bilateral femurs with a partially
visualized large expansile mass within the posterior aspect of the
left ilium with associated extraosseous soft tissue mass and likely
infiltration of the adjacent iliacus and gluteus minimus
musculature. Findings are most consistent with metastatic disease.
Please refer to pathology results from percutaneous biopsy of the
left iliac lesion from [DATE].
2. No pathologic fractures identified.
3. Small amount of free fluid within the pelvis, nonspecific.
4. Colonic diverticulosis.

## 2020-12-08 MED ORDER — GADOBENATE DIMEGLUMINE 529 MG/ML IV SOLN
17.0000 mL | Freq: Once | INTRAVENOUS | Status: AC | PRN
  Administered 2020-12-08: 17 mL via INTRAVENOUS

## 2020-12-11 ENCOUNTER — Inpatient Hospital Stay: Payer: Medicare Other | Attending: Radiation Oncology

## 2020-12-12 ENCOUNTER — Ambulatory Visit
Admission: RE | Admit: 2020-12-12 | Discharge: 2020-12-12 | Disposition: A | Discharge status: Home / Self Care | Payer: BLUE CROSS/BLUE SHIELD | Source: Ambulatory Visit | Attending: Radiation Oncology | Admitting: Radiation Oncology

## 2020-12-12 ENCOUNTER — Other Ambulatory Visit: Payer: Self-pay

## 2020-12-12 VITALS — BP 130/68 | HR 74 | Temp 97.7°F | Resp 18 | Wt 185.0 lb

## 2020-12-12 DIAGNOSIS — Z79899 Other long term (current) drug therapy: Secondary | ICD-10-CM | POA: Diagnosis not present

## 2020-12-12 DIAGNOSIS — M47816 Spondylosis without myelopathy or radiculopathy, lumbar region: Secondary | ICD-10-CM | POA: Diagnosis not present

## 2020-12-12 DIAGNOSIS — C439 Malignant melanoma of skin, unspecified: Secondary | ICD-10-CM | POA: Diagnosis not present

## 2020-12-12 DIAGNOSIS — Z510 Encounter for antineoplastic radiation therapy: Secondary | ICD-10-CM | POA: Insufficient documentation

## 2020-12-12 DIAGNOSIS — C7951 Secondary malignant neoplasm of bone: Secondary | ICD-10-CM | POA: Insufficient documentation

## 2020-12-12 DIAGNOSIS — Z8041 Family history of malignant neoplasm of ovary: Secondary | ICD-10-CM | POA: Diagnosis not present

## 2020-12-12 DIAGNOSIS — M48061 Spinal stenosis, lumbar region without neurogenic claudication: Secondary | ICD-10-CM | POA: Diagnosis not present

## 2020-12-12 DIAGNOSIS — Z801 Family history of malignant neoplasm of trachea, bronchus and lung: Secondary | ICD-10-CM | POA: Diagnosis not present

## 2020-12-12 NOTE — Progress Notes (Signed)
William Wilkerson presents today for follow-up to discuss MRI results from 12/08/2020 and if he is interested in pursuing any more palliative radiation to symptomatic areas  Pain: most concerning areas are lower back and sides. States the steroid Dr. Isidore Moos prescribed helped significantly (more so than Tramadol) Mobility: reports his pain can interfere with his mobility. Occasionally will use a can at his wife encouragement Nausea: patient reports nausea has improved and he no longer needs to utilize the Zofran Wt Readings from Last 3 Encounters:  12/12/20 185 lb (83.9 kg)  03/13/18 188 lb 6.4 oz (85.5 kg)  01/06/18 200 lb 9.6 oz (91 kg)   Other issues of note: Scheduled to see Dr. Tama Gander [facility]chos [facility]) on 12/15/2020  Vitals:   12/12/20 1449  BP: 130/68  Pulse: 74  Resp: 18  Temp: 97.7 F (36.5 C)  SpO2: 99%

## 2020-12-13 ENCOUNTER — Other Ambulatory Visit: Payer: Self-pay

## 2020-12-13 DIAGNOSIS — C7951 Secondary malignant neoplasm of bone: Secondary | ICD-10-CM

## 2020-12-13 MED ORDER — DEXAMETHASONE 4 MG PO TABS: 4.0000 mg | ORAL_TABLET | Freq: Every day | ORAL | 0 refills | Status: DC

## 2020-12-13 NOTE — Progress Notes (Signed)
Radiation Oncology         [phone] ________________________________  Name: William Wilkerson MRN: [id number]  Date: 12/12/2020  DOB: 07-Jan-1951  Follow-Up Visit Note  Outpatient  CC: [name], MD  [name], MD  Diagnosis and Prior Radiotherapy:    ICD-10-CM   1. 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  He is with his wife today.  He continues to have significant back pain.  The dexamethasone has helped and he has been taking 3 to 4 tablets a day (he titrated himself up to this) because 2 tablets did not help enough.  He continues to have low back pain that radiates around his left thigh.  He is ambulatory and denies any significant weakness in his legs or new bowel or bladder symptoms.  However, the pain does affect his mobility and sometimes he uses a cane.  He underwent MRI of the lumbar and sacral spine and this was reviewed at our CNS tumor board.  The consensus is that he has diffuse metastatic disease throughout the entire lumbosacral spine.  I shared this information with the patient and his wife.  His wife is appropriately tearful.  She is upset to hear that his cancer is aggressive.  They have a pending appointment to follow-up with his medical oncologist at [facility].                         His nausea has been helped by the Zofran that I prescribed previously, and recently his nausea improved to the point that he does not need the Zofran anymore  ALLERGIES:  is allergic to codeine, hydromorphone, morphine, and percocet [oxycodone-acetaminophen].  Meds: Current Outpatient Medications  Medication Sig Dispense Refill  . amLODipine (NORVASC) 10 MG tablet Take 10 mg by mouth daily.    Marland Kitchen dexamethasone (DECADRON) 4 MG tablet Take 2 tablets daily by mouth. Take with food. 40 tablet 0  . dexamethasone (DECADRON) 4 MG tablet Take 1 tablet (4 mg total) by mouth daily. Take as directed with food. Do  not exceed 4 tablets daily. 90 tablet 0  . DULoxetine (CYMBALTA) 20 MG capsule Take 20 mg by mouth daily.    Marland Kitchen ibuprofen (ADVIL,MOTRIN) 800 MG tablet Take 800 mg by mouth every 8 (eight) hours as needed for mild pain.    . indomethacin (INDOCIN) 50 MG capsule Take 50 mg by mouth 3 (three) times daily as needed.    Marland Kitchen losartan (COZAAR) 100 MG tablet Take 100 mg by mouth daily.    . ondansetron (ZOFRAN ODT) 8 MG disintegrating tablet Take 1 tablet (8 mg total) by mouth every 8 (eight) hours as needed for nausea or vomiting. 30 tablet 3  . pantoprazole (PROTONIX) 40 MG tablet Take 40 mg by mouth daily.    . traMADol (ULTRAM) 50 MG tablet Take 50 mg by mouth every 6 (six) hours as needed. (Patient not taking: Reported on 10/13/2020)    . zolpidem (AMBIEN CR) 6.25 MG CR tablet Take 6.25 mg by mouth at bedtime as needed for sleep.     No current facility-administered medications for this encounter.    Physical Findings: The patient is in no acute distress. Patient is alert and oriented.  weight is 185 lb (83.9 kg). His temporal temperature is 97.7 F (36.5 C). His blood pressure is 130/68 and his pulse is 74. His respiration is  18 and oxygen saturation is 99%. .     Tenderness to palpation throughout the majority of the lumbar spine and the upper sacral spine.  Grossly, he has no focal deficits neurologically.  He ambulates independently.   Lab Findings: Lab Results  Component Value Date   WBC 4.8 06/27/2017   HGB 12.5 (L) 06/27/2017   HCT 35.7 (L) 06/27/2017   MCV 88.3 06/27/2017   PLT 361 06/27/2017    Radiographic Findings: MR Lumbar Spine W Wo Contrast  Result Date: 12/10/2020 CLINICAL DATA:  Low back pain radiating to the left hip for 3 months. History of lung cancer, prostate cancer, and melanoma. Known metastatic bone lesion in the left ilium. EXAM: MRI LUMBAR SPINE WITHOUT AND WITH CONTRAST TECHNIQUE: Multiplanar and multiecho pulse sequences of the lumbar spine were obtained  without and with intravenous contrast. CONTRAST:  26mL MULTIHANCE GADOBENATE DIMEGLUMINE 529 MG/ML IV SOLN COMPARISON:  CT 01/25/2019, 09/18/2020 FINDINGS: Segmentation:  Standard. Alignment:  Trace retrolisthesis L4 on L5. Vertebrae: There is diffusely decreased T1 marrow signal throughout the visualized osseous structures of the spine and bony pelvis. Heterogeneous postcontrast enhancement of the marrow structures. Partially visualized large expansile marrow replacing bone lesion within the left ilium measuring at least 6.3 cm in AP dimension (series 17, image 42). Superior endplate compression fracture of L1 with minimal residual edema along the fracture margin (series 20, image 10). Approximately 20% vertebral body height loss and trace retropulsion of the posterior wall of the vertebral body. No additional fractures. No evidence of discitis. Conus medullaris and cauda equina: Conus extends to the L2 level. Conus and cauda equina appear normal. Paraspinal and other soft tissues: Negative. Disc levels: T12-L1: No significant disc protrusion, foraminal stenosis, or canal stenosis. L1-L2: No significant disc protrusion, foraminal stenosis, or canal stenosis. L2-L3: No significant disc protrusion, foraminal stenosis, or canal stenosis. L3-L4: Disc height loss with mild circumferential disc bulge. There is ligamentum flavum buckling. Findings result in mild canal stenosis and mild bilateral foraminal stenosis. L4-L5: Advanced disc height loss with disc bulge and endplate spurring. Mild bilateral facet hypertrophy. Bilateral facet joint effusions. Ligamentum flavum buckling. Findings contribute to mild canal stenosis with moderate bilateral foraminal stenosis. L5-S1: Disc height loss with mild circumferential disc bulge and endplate spurring. Bilateral facet hypertrophy with bilateral facet joint effusions. Ligamentum flavum buckling. Moderate right worse than left foraminal stenosis. No significant canal stenosis.  IMPRESSION: 1. Subacute-to-chronic appearing superior endplate compression fracture of L1 with minimal residual edema along the fracture margin. Approximately 20% vertebral body height loss and trace retropulsion of the posterior wall of the vertebral body. No associated canal stenosis or cord impingement. 2. Lumbar spondylosis most pronounced at the L4-5 and L5-S1 levels where there is mild canal stenosis and moderate bilateral foraminal stenosis. 3. Diffusely infiltrated appearance of the visualized marrow structures of the spine and pelvis suggesting diffuse osseous metastatic disease. 4. Partially visualized large expansile marrow replacing bone lesion within the left ilium measuring at least 6.3 cm in AP dimension. Please refer to pathology results from percutaneous biopsy of this lesion from 09/18/2020. Electronically Signed   By: Davina Poke D.O.   On: 12/10/2020 10:14   MR SACRUM SI JOINTS W WO CONTRAST  Result Date: 12/10/2020 CLINICAL DATA:  Severe low back pain. History of lung cancer, prostate cancer, and melanoma EXAM: MRI SACRUM WITHOUT AND WITH CONTRAST TECHNIQUE: Multiplanar and multiecho pulse sequences of the sacrum were obtained without and with intravenous contrast. CONTRAST:  51mL MULTIHANCE GADOBENATE DIMEGLUMINE  529 MG/ML IV SOLN COMPARISON:  CT 10/17/2020, 09/10/2019 FINDINGS: Bones/Joint/Cartilage Widespread marrow infiltration of the visualized lower lumbar spine, pelvis, and proximal bilateral femurs. Partially visualized large expansile mass within the posterior aspect of the left ilium measuring at least 8.0 x 6.3 cm (series 8, image 18; series 3, image 5). There is cortical expansion/breakthrough with associated extraosseous soft tissue mass and likely infiltration of the adjacent iliacus and gluteus minimus musculature. No pathologic fractures are identified. Degenerative disc disease within the visualized lower lumbar spine, as characterized on concurrently obtained lumbar  spine exam. Ligaments Grossly intact. Muscles and Tendons Suspected tumor infiltration within the left iliacus and left gluteus minimus musculature. Soft tissues Small amount of free fluid within the pelvis, nonspecific. Colonic diverticulosis. IMPRESSION: 1. Widespread marrow infiltration of the visualized lower lumbar spine, pelvis, and proximal bilateral femurs with a partially visualized large expansile mass within the posterior aspect of the left ilium with associated extraosseous soft tissue mass and likely infiltration of the adjacent iliacus and gluteus minimus musculature. Findings are most consistent with metastatic disease. Please refer to pathology results from percutaneous biopsy of the left iliac lesion from 09/18/2020. 2. No pathologic fractures identified. 3. Small amount of free fluid within the pelvis, nonspecific. 4. Colonic diverticulosis. Electronically Signed   By: Davina Poke D.O.   On: 12/10/2020 10:21    Impression/Plan: Today, I talked to the patient and his wife about the findings and work-up thus far. We discussed the patient's diagnosis of metastatic melanoma, which initially was symptomatic in the left ilium, but now clearly demonstrates diffuse metastatic disease throughout the lower spine.  He is symptomatic from roughly L1-S4.  I recommend that we treat these areas with IMRT (given recent radiation to the left ilium that overlaps some of the symptomatic areas ).  Fortunately he responded well to the palliative radiation to his left ilium and I hope that the radiation therapy to his spine will help his pain.  I offered to turn around his treatment quickly but he would like to start treatment after the holidays so we will start the treatment in early January per his request.  He has been taking the dexamethasone 4 mg tablet up to 4 times a day.  I have prescribed to be twice a day but he stated this did not help his pain enough.  3-4 times a day has been working well for  him.  I recommended that he try to take it 3 times a day and only 4 times a day if absolutely necessary.  He is on a PPI for GI prophylaxis.  He knows to take this medication with food as well.  He has follow-up with medical oncology later this week and he and his wife understand that systemic therapy may be warranted in the future.  His wife was appropriately tearful today and emotional support was given.  I think both of them are building insight regarding his disease and they are eager for him to get some palliative relief.  We discussed the risks, benefits, and side effects of radiotherapy. Side effects may include but not necessarily be limited to: GI upset, fatigue, skin irritation, bone damage, rare internal organ or spinal cord injury. No guarantees of treatment were given. A consent form was signed and placed in the patient's medical record. The patient was encouraged to ask questions that I answered to the best of my ability.   As above, we will proceed with treatment planning today and start his treatment  in early January per the patient's request to get through the holidays first.   On date of service, in total, I spent 20 minutes on this encounter. Patient was seen in person.  _____________________________________   Eppie Gibson, MD

## 2020-12-14 ENCOUNTER — Ambulatory Visit: Payer: BLUE CROSS/BLUE SHIELD

## 2020-12-15 ENCOUNTER — Ambulatory Visit: Payer: BLUE CROSS/BLUE SHIELD

## 2020-12-15 ENCOUNTER — Encounter: Payer: Self-pay | Admitting: Radiation Oncology

## 2020-12-18 ENCOUNTER — Ambulatory Visit: Payer: BLUE CROSS/BLUE SHIELD

## 2020-12-18 DIAGNOSIS — Z510 Encounter for antineoplastic radiation therapy: Secondary | ICD-10-CM | POA: Diagnosis not present

## 2020-12-18 NOTE — Progress Notes (Signed)
  Patient Name: William Wilkerson MRN: [id number] DOB: October 15, 1951 Referring Physician: [name] Date of Service: 10/27/2020 [facility], [place]                                                        End Of Treatment Note  Diagnoses: C79.51-Secondary malignant neoplasm of bone  Cancer Staging: STAGE IV MELANOMA  Intent: Palliative  Radiation Treatment Dates: 10/23/2020 through 10/27/2020 Site Technique Total Dose (Gy) Dose per Fx (Gy) Completed Fx Beam Energies  Ilium, Left: Pelvis_Lt_Ilium 3D 20/20 4 5/5 10X, 15X   Narrative: The patient tolerated radiation therapy relatively well.   Plan: The patient will follow-up with radiation oncology in 20mo.  -----------------------------------  Eppie Gibson, MD

## 2020-12-19 ENCOUNTER — Ambulatory Visit: Payer: BLUE CROSS/BLUE SHIELD

## 2020-12-20 ENCOUNTER — Ambulatory Visit: Payer: BLUE CROSS/BLUE SHIELD

## 2020-12-21 ENCOUNTER — Ambulatory Visit: Payer: BLUE CROSS/BLUE SHIELD

## 2020-12-25 ENCOUNTER — Ambulatory Visit: Payer: BLUE CROSS/BLUE SHIELD

## 2020-12-26 ENCOUNTER — Ambulatory Visit: Payer: BLUE CROSS/BLUE SHIELD

## 2020-12-27 ENCOUNTER — Ambulatory Visit: Payer: BLUE CROSS/BLUE SHIELD

## 2020-12-28 ENCOUNTER — Ambulatory Visit: Payer: BLUE CROSS/BLUE SHIELD

## 2021-01-01 ENCOUNTER — Ambulatory Visit
Admission: RE | Admit: 2021-01-01 | Discharge: 2021-01-01 | Disposition: A | Discharge status: Home / Self Care | Payer: BLUE CROSS/BLUE SHIELD | Source: Ambulatory Visit | Attending: Radiation Oncology | Admitting: Radiation Oncology

## 2021-01-01 ENCOUNTER — Ambulatory Visit: Payer: BLUE CROSS/BLUE SHIELD

## 2021-01-01 ENCOUNTER — Other Ambulatory Visit: Payer: Self-pay | Admitting: Radiation Oncology

## 2021-01-01 ENCOUNTER — Other Ambulatory Visit: Payer: Self-pay

## 2021-01-01 DIAGNOSIS — Z510 Encounter for antineoplastic radiation therapy: Secondary | ICD-10-CM | POA: Diagnosis present

## 2021-01-01 DIAGNOSIS — C439 Malignant melanoma of skin, unspecified: Secondary | ICD-10-CM | POA: Insufficient documentation

## 2021-01-01 DIAGNOSIS — C7951 Secondary malignant neoplasm of bone: Secondary | ICD-10-CM | POA: Diagnosis present

## 2021-01-02 ENCOUNTER — Ambulatory Visit
Admission: RE | Admit: 2021-01-02 | Discharge: 2021-01-02 | Disposition: A | Discharge status: Home / Self Care | Payer: BLUE CROSS/BLUE SHIELD | Source: Ambulatory Visit | Attending: Radiation Oncology | Admitting: Radiation Oncology

## 2021-01-02 ENCOUNTER — Ambulatory Visit: Payer: BLUE CROSS/BLUE SHIELD

## 2021-01-02 ENCOUNTER — Other Ambulatory Visit: Payer: Self-pay

## 2021-01-02 DIAGNOSIS — Z510 Encounter for antineoplastic radiation therapy: Secondary | ICD-10-CM | POA: Diagnosis not present

## 2021-01-03 ENCOUNTER — Ambulatory Visit: Payer: BLUE CROSS/BLUE SHIELD

## 2021-01-03 ENCOUNTER — Telehealth: Payer: Self-pay

## 2021-01-03 NOTE — Telephone Encounter (Signed)
Recieved VM from patient's wife that patient would not be able to make it to his XRT appointment today because he was currently in the ED at [facility].  Returned wife's call to get more information on patient's situation. Wife stated that [name] "had a rough night last night". She reported that he fell twice and was very agitated (kept trying to leave the house to get some air). She called patient's medical oncologist at [facility] (Dr. Clance Boll) who advised her to take patient to ED and [facility]. While there it was discovered that patient had pneumonia and was dehydrated. She states the plan is to admit the patient for IVF and IV antibiotics, but they are still waiting on a bed to become available. Wife asked if patient's XRT plan could be sent over to [facility] for patient to finish treatment while in the hospital. Informed wife I would ask Dr. Isidore Moos and her team, but couldn't make any garuntees. Wife verbalized understanding and denied any other needs at this time.   Updated Dr. Isidore Moos on patient's status and wife's request to have XRT continued at [facility]. Dr. Isidore Moos spoke with Dr. Clance Boll directly, and it was determined that patient is currently not medically stable enough to continue XRT. Plan is to manage patient's pain, rehydrate, treat infection, and then reassess when patient can restart XRT at a later date.   Called patient's wife back to relay above information. She did not answer so I left a detailed message, and provided my direct call back number should she have any other questions/concerns.

## 2021-01-04 ENCOUNTER — Ambulatory Visit: Payer: BLUE CROSS/BLUE SHIELD

## 2021-01-05 ENCOUNTER — Ambulatory Visit: Payer: BLUE CROSS/BLUE SHIELD

## 2021-01-08 ENCOUNTER — Ambulatory Visit: Payer: BLUE CROSS/BLUE SHIELD

## 2021-01-09 ENCOUNTER — Other Ambulatory Visit: Payer: Self-pay

## 2021-01-09 ENCOUNTER — Ambulatory Visit: Payer: BLUE CROSS/BLUE SHIELD

## 2021-01-09 ENCOUNTER — Ambulatory Visit
Admission: RE | Admit: 2021-01-09 | Discharge: 2021-01-09 | Disposition: A | Discharge status: Home / Self Care | Payer: BLUE CROSS/BLUE SHIELD | Source: Ambulatory Visit | Attending: Radiation Oncology | Admitting: Radiation Oncology

## 2021-01-09 DIAGNOSIS — Z510 Encounter for antineoplastic radiation therapy: Secondary | ICD-10-CM | POA: Diagnosis not present

## 2021-01-10 ENCOUNTER — Ambulatory Visit: Payer: BLUE CROSS/BLUE SHIELD

## 2021-01-10 ENCOUNTER — Ambulatory Visit
Admission: RE | Admit: 2021-01-10 | Discharge: 2021-01-10 | Disposition: A | Discharge status: Home / Self Care | Payer: BLUE CROSS/BLUE SHIELD | Source: Ambulatory Visit | Attending: Radiation Oncology | Admitting: Radiation Oncology

## 2021-01-10 DIAGNOSIS — Z510 Encounter for antineoplastic radiation therapy: Secondary | ICD-10-CM | POA: Diagnosis not present

## 2021-01-11 ENCOUNTER — Ambulatory Visit: Payer: BLUE CROSS/BLUE SHIELD

## 2021-01-11 ENCOUNTER — Ambulatory Visit
Admission: RE | Admit: 2021-01-11 | Discharge: 2021-01-11 | Disposition: A | Discharge status: Home / Self Care | Payer: BLUE CROSS/BLUE SHIELD | Source: Ambulatory Visit | Attending: Radiation Oncology | Admitting: Radiation Oncology

## 2021-01-11 DIAGNOSIS — Z510 Encounter for antineoplastic radiation therapy: Secondary | ICD-10-CM | POA: Diagnosis not present

## 2021-01-12 ENCOUNTER — Ambulatory Visit: Payer: BLUE CROSS/BLUE SHIELD

## 2021-01-15 ENCOUNTER — Ambulatory Visit: Payer: BLUE CROSS/BLUE SHIELD

## 2021-01-16 ENCOUNTER — Other Ambulatory Visit: Payer: Self-pay

## 2021-01-16 ENCOUNTER — Ambulatory Visit (HOSPITAL_COMMUNITY)
Admission: RE | Admit: 2021-01-16 | Discharge: 2021-01-16 | Disposition: A | Discharge status: Home / Self Care | Payer: BLUE CROSS/BLUE SHIELD | Source: Ambulatory Visit | Attending: Radiation Oncology | Admitting: Radiation Oncology

## 2021-01-16 ENCOUNTER — Ambulatory Visit
Admission: RE | Admit: 2021-01-16 | Discharge: 2021-01-16 | Disposition: A | Discharge status: Home / Self Care | Payer: BLUE CROSS/BLUE SHIELD | Source: Ambulatory Visit | Attending: Radiation Oncology | Admitting: Radiation Oncology

## 2021-01-16 ENCOUNTER — Encounter: Payer: Self-pay | Admitting: Radiation Oncology

## 2021-01-16 ENCOUNTER — Inpatient Hospital Stay
Admission: AD | Admit: 2021-01-16 | Payer: BLUE CROSS/BLUE SHIELD | Source: Ambulatory Visit | Admitting: Internal Medicine

## 2021-01-16 DIAGNOSIS — Z510 Encounter for antineoplastic radiation therapy: Secondary | ICD-10-CM | POA: Diagnosis not present

## 2021-01-16 DIAGNOSIS — M7989 Other specified soft tissue disorders: Secondary | ICD-10-CM

## 2021-01-16 NOTE — Progress Notes (Signed)
Patient Name: William Wilkerson, William Wilkerson MRN: [id number] DOB: Dec 08, 1951 Primary Physician: Eppie Gibson, M.D. Referring Physician: [name], M.D. Diagnosis: C79.51 Secondary malignant neoplasm of bone                     Radiation Oncology Weekly Treatment Note ______________________________________________________________________________ Visit Date: 2021-01-16               Physician:Jeanita Carneiro Isidore Moos, M.D.               Visit Type: Out-Patient   Prescriptions Name Site Technique Fx Gy/Fx IGRT  Pelvis_Lt_Ilium Ilium, Left 3D 5 4.00 Daily: IGCT (CT guidance).     Plans Name Current Fx Current Dose (Gy) Last Tx Status  Pelvis_Lt_Ilm 5 of 5 20 of 20 2020-10-27 01:33 TreatApproval  Spine_L-S 6 of 10 18 of 30 2021-01-16 01:11 TreatApproval    Vitals Date Wt(lbs) Pls-Seat Pls-Stan Resp BP-Seate BP-Stand BP-Supin Temp(F) O2(%) ECOG Pain IP  2021-01-16 181.8 118 n/a 24 136/79 n/a n/a 98.4 95 n/a n/a n/a    Weekly Progress Notes 2021-01-16:  Patient presents today after 6th fraction to his lower spine. Patient somnolent in wheelchair, so wife is mainly responding on patient"s behalf. She reports his pain in his legs and lower back has increased (which she feels is related to his lower extremity edema). She states he doesn"t have much of an appetite and vomited this morning getting out of the car. He feels much weaker and fatigued, and has difficulty moving  because of the swelling in his legs. She states his Decadron was stopped when patient was hospitalized, and he"s now taking IR and ER oxycodone. Wheezing more per wife. Provided 1 month F/U card.  Physical Exam 2021-01-16:  Alert, no acute distress. Somnolent,  LE pitting edema.  Tender to palpation in right calf.   Heart is tachycardic but regular in rhythm.  Chest CTAB  Patient Education 2021-01-16:  Ongoing education performed.  Impression/Plan 2021-01-16:  The patient is tolerating radiation. Continue treatment as planned. He is however failing  to thrive with new onset b/l leg edema. He is also nauseous and tachycardic. Recently admitted twice to [facility] in the past month or so. He is eligible for hospice due to widespread metastatic melanoma.  He and his wife have, to this point, desired to defer hospice and continue radiation here at [facility] and Medical Oncology systemic treatments available at [facility] w/ Dr. Ashok Pall.   Patient and wife would prefer a direct admission than go to the ED.  I spoke w/ Dr Linda Hedges who will graciously admit him when a bed is available. We contacted radiology to see if we can pursue doppler US of the LE's now to hasten his workup but we are awaiting a call back. If this cannot be done this afternoon in a timely manner, pt will go home and return when called with bed availability. Instructed wife to give him his Ondansetron 8mg  ODT every 8 hours for nausea.  Labs Reviewed.   Film Status All films were approved.  -----------------------------------  Eppie Gibson, MD

## 2021-01-16 NOTE — Progress Notes (Signed)
Bilateral lower extremity venous duplex has been completed. Preliminary results can be found in CV Proc through chart review.  Results were given to [facility] at Dr. Pearlie Oyster office.  01/16/21 3:51 PM William Wilkerson RVT

## 2021-01-17 ENCOUNTER — Other Ambulatory Visit: Payer: Self-pay

## 2021-01-17 ENCOUNTER — Ambulatory Visit: Payer: BLUE CROSS/BLUE SHIELD

## 2021-01-17 ENCOUNTER — Encounter: Payer: Self-pay | Admitting: Radiation Oncology

## 2021-01-17 DIAGNOSIS — C7951 Secondary malignant neoplasm of bone: Secondary | ICD-10-CM

## 2021-01-18 ENCOUNTER — Ambulatory Visit: Payer: BLUE CROSS/BLUE SHIELD

## 2021-01-19 ENCOUNTER — Ambulatory Visit: Payer: BLUE CROSS/BLUE SHIELD

## 2021-01-22 ENCOUNTER — Ambulatory Visit: Payer: BLUE CROSS/BLUE SHIELD

## 2021-02-27 ENCOUNTER — Telehealth: Payer: Self-pay

## 2021-02-27 NOTE — Telephone Encounter (Signed)
Called both patient's home number and wife's cell phone number listed in chart. Unable to reach either. Left VM inquiring about patient's status, and if he and his wife were interested in having a F/U with Dr. Isidore Moos, or would prefer to cancel and F/U up as needed. Provided direct call back number for patient or wife to call me back with preference.

## 2021-02-28 ENCOUNTER — Ambulatory Visit: Payer: BLUE CROSS/BLUE SHIELD | Admitting: Radiation Oncology

## 2021-04-29 DEATH — deceased

## 2021-06-29 ENCOUNTER — Encounter: Payer: Self-pay | Admitting: Oncology

## 2021-07-29 ENCOUNTER — Encounter: Payer: Self-pay | Admitting: Oncology

## 2021-07-29 NOTE — Progress Notes (Signed)
  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  He is however failing to thrive with new onset b/l leg edema. He is also nauseous and tachycardic. Recently admitted twice to [facility] in the past month or so. He is eligible for hospice due to widespread metastatic melanoma.  I conferred with his medical oncologist at [facility].  Patient will be admitted there with likely hospice enrollment. He and his wife elected to stop RT prematurely.  Plan: Nursing will contact him to arrange follow-up if his disposition allows/warrants.  -----------------------------------  Eppie Gibson, MD
